# Patient Record
Sex: Female | Born: 1960 | Race: White | Hispanic: No | Marital: Married | Smoking: Never smoker
Health system: Southern US, Community
[De-identification: ages and names within clinical notes are randomized; demographics above are authoritative.]

## PROBLEM LIST (undated history)

## (undated) DIAGNOSIS — Z973 Presence of spectacles and contact lenses: Secondary | ICD-10-CM

## (undated) DIAGNOSIS — M1990 Unspecified osteoarthritis, unspecified site: Secondary | ICD-10-CM

## (undated) DIAGNOSIS — F32A Depression, unspecified: Secondary | ICD-10-CM

## (undated) DIAGNOSIS — R112 Nausea with vomiting, unspecified: Secondary | ICD-10-CM

## (undated) DIAGNOSIS — K219 Gastro-esophageal reflux disease without esophagitis: Secondary | ICD-10-CM

## (undated) DIAGNOSIS — E039 Hypothyroidism, unspecified: Secondary | ICD-10-CM

## (undated) DIAGNOSIS — I10 Essential (primary) hypertension: Secondary | ICD-10-CM

## (undated) DIAGNOSIS — R42 Dizziness and giddiness: Secondary | ICD-10-CM

## (undated) DIAGNOSIS — F419 Anxiety disorder, unspecified: Secondary | ICD-10-CM

## (undated) DIAGNOSIS — N189 Chronic kidney disease, unspecified: Secondary | ICD-10-CM

## (undated) DIAGNOSIS — E669 Obesity, unspecified: Secondary | ICD-10-CM

## (undated) DIAGNOSIS — G43909 Migraine, unspecified, not intractable, without status migrainosus: Secondary | ICD-10-CM

## (undated) DIAGNOSIS — D869 Sarcoidosis, unspecified: Secondary | ICD-10-CM

## (undated) DIAGNOSIS — T7840XA Allergy, unspecified, initial encounter: Secondary | ICD-10-CM

## (undated) DIAGNOSIS — K760 Fatty (change of) liver, not elsewhere classified: Secondary | ICD-10-CM

## (undated) DIAGNOSIS — R011 Cardiac murmur, unspecified: Secondary | ICD-10-CM

## (undated) DIAGNOSIS — N183 Chronic kidney disease, stage 3 unspecified: Secondary | ICD-10-CM

## (undated) DIAGNOSIS — Z9889 Other specified postprocedural states: Secondary | ICD-10-CM

## (undated) HISTORY — PX: LAPAROSCOPIC GASTRIC BANDING: SHX1100

## (undated) HISTORY — PX: LUNG BIOPSY: SHX232

## (undated) HISTORY — PX: TUBAL LIGATION: SHX77

## (undated) HISTORY — PX: COLONOSCOPY: SHX174

## (undated) HISTORY — PX: ABDOMINAL HYSTERECTOMY: SHX81

## (undated) HISTORY — PX: LAPAROSCOPIC OOPHERECTOMY: SHX6507

## (undated) HISTORY — PX: ABDOMINOPLASTY: SUR9

## (undated) HISTORY — PX: ESOPHAGOGASTRODUODENOSCOPY: SHX1529

---

## 1988-11-21 DIAGNOSIS — I639 Cerebral infarction, unspecified: Secondary | ICD-10-CM

## 1988-11-21 HISTORY — DX: Cerebral infarction, unspecified: I63.9

## 1999-02-19 ENCOUNTER — Other Ambulatory Visit: Admission: RE | Admit: 1999-02-19 | Discharge: 1999-02-19 | Payer: Self-pay | Admitting: Obstetrics and Gynecology

## 1999-03-18 ENCOUNTER — Ambulatory Visit (HOSPITAL_COMMUNITY): Admission: RE | Admit: 1999-03-18 | Discharge: 1999-03-18 | Payer: Self-pay | Admitting: Endocrinology

## 1999-03-19 ENCOUNTER — Encounter: Payer: Self-pay | Admitting: Endocrinology

## 1999-04-05 ENCOUNTER — Encounter: Payer: Self-pay | Admitting: Endocrinology

## 1999-04-05 ENCOUNTER — Ambulatory Visit (HOSPITAL_COMMUNITY): Admission: RE | Admit: 1999-04-05 | Discharge: 1999-04-05 | Payer: Self-pay | Admitting: Endocrinology

## 2000-02-23 ENCOUNTER — Other Ambulatory Visit: Admission: RE | Admit: 2000-02-23 | Discharge: 2000-02-23 | Payer: Self-pay | Admitting: Emergency Medicine

## 2005-10-20 ENCOUNTER — Ambulatory Visit: Payer: Self-pay | Admitting: Unknown Physician Specialty

## 2006-12-12 ENCOUNTER — Emergency Department: Payer: Self-pay | Admitting: Emergency Medicine

## 2007-11-02 ENCOUNTER — Encounter: Admission: RE | Admit: 2007-11-02 | Discharge: 2007-11-02 | Payer: Self-pay | Admitting: Neurosurgery

## 2009-05-21 ENCOUNTER — Ambulatory Visit: Payer: Self-pay | Admitting: Internal Medicine

## 2009-06-09 ENCOUNTER — Ambulatory Visit: Payer: Self-pay | Admitting: Family

## 2009-06-12 ENCOUNTER — Ambulatory Visit: Payer: Self-pay | Admitting: Internal Medicine

## 2009-06-17 ENCOUNTER — Ambulatory Visit: Payer: Self-pay | Admitting: Internal Medicine

## 2009-06-21 ENCOUNTER — Ambulatory Visit: Payer: Self-pay | Admitting: Internal Medicine

## 2009-07-21 ENCOUNTER — Ambulatory Visit: Payer: Self-pay | Admitting: General Surgery

## 2009-07-22 ENCOUNTER — Ambulatory Visit: Payer: Self-pay | Admitting: General Surgery

## 2011-05-06 ENCOUNTER — Ambulatory Visit: Payer: Self-pay | Admitting: Internal Medicine

## 2011-05-23 ENCOUNTER — Ambulatory Visit: Payer: Self-pay | Admitting: Internal Medicine

## 2012-11-06 ENCOUNTER — Ambulatory Visit: Payer: Self-pay | Admitting: Gastroenterology

## 2013-01-18 ENCOUNTER — Ambulatory Visit: Payer: Self-pay | Admitting: Nurse Practitioner

## 2013-08-26 ENCOUNTER — Ambulatory Visit: Payer: Self-pay | Admitting: Orthopedic Surgery

## 2014-11-05 ENCOUNTER — Emergency Department: Payer: Self-pay | Admitting: Student

## 2014-11-21 HISTORY — PX: REDUCTION MAMMAPLASTY: SUR839

## 2015-03-17 DIAGNOSIS — Z9884 Bariatric surgery status: Secondary | ICD-10-CM | POA: Insufficient documentation

## 2015-06-11 ENCOUNTER — Other Ambulatory Visit: Payer: Self-pay

## 2015-06-22 ENCOUNTER — Ambulatory Visit: Admission: RE | Admit: 2015-06-22 | Payer: Self-pay | Source: Ambulatory Visit | Admitting: Obstetrics and Gynecology

## 2015-06-22 ENCOUNTER — Encounter: Admission: RE | Payer: Self-pay | Source: Ambulatory Visit

## 2015-06-22 SURGERY — ANTERIOR (CYSTOCELE) AND POSTERIOR REPAIR (RECTOCELE)
Anesthesia: Choice

## 2015-11-09 ENCOUNTER — Other Ambulatory Visit: Payer: Self-pay | Admitting: Nurse Practitioner

## 2016-11-09 ENCOUNTER — Other Ambulatory Visit: Payer: Self-pay | Admitting: Nurse Practitioner

## 2016-12-27 ENCOUNTER — Other Ambulatory Visit: Payer: Self-pay | Admitting: Orthopedic Surgery

## 2016-12-27 DIAGNOSIS — M5412 Radiculopathy, cervical region: Secondary | ICD-10-CM

## 2016-12-27 DIAGNOSIS — M5030 Other cervical disc degeneration, unspecified cervical region: Secondary | ICD-10-CM

## 2017-01-05 ENCOUNTER — Ambulatory Visit
Admission: RE | Admit: 2017-01-05 | Discharge: 2017-01-05 | Disposition: A | Discharge status: Home / Self Care | Payer: BC Managed Care – PPO | Source: Ambulatory Visit | Attending: Orthopedic Surgery | Admitting: Orthopedic Surgery

## 2017-01-05 ENCOUNTER — Encounter: Payer: Self-pay | Admitting: Radiology

## 2017-01-05 DIAGNOSIS — M5030 Other cervical disc degeneration, unspecified cervical region: Secondary | ICD-10-CM | POA: Insufficient documentation

## 2017-01-05 DIAGNOSIS — M5412 Radiculopathy, cervical region: Secondary | ICD-10-CM | POA: Diagnosis not present

## 2018-11-27 DIAGNOSIS — Z862 Personal history of diseases of the blood and blood-forming organs and certain disorders involving the immune mechanism: Secondary | ICD-10-CM | POA: Insufficient documentation

## 2018-11-27 DIAGNOSIS — Z8673 Personal history of transient ischemic attack (TIA), and cerebral infarction without residual deficits: Secondary | ICD-10-CM | POA: Insufficient documentation

## 2018-11-27 DIAGNOSIS — I6930 Unspecified sequelae of cerebral infarction: Secondary | ICD-10-CM | POA: Insufficient documentation

## 2018-11-27 DIAGNOSIS — F419 Anxiety disorder, unspecified: Secondary | ICD-10-CM | POA: Insufficient documentation

## 2018-11-27 DIAGNOSIS — F32A Depression, unspecified: Secondary | ICD-10-CM | POA: Insufficient documentation

## 2019-04-02 DIAGNOSIS — Z83719 Family history of colon polyps, unspecified: Secondary | ICD-10-CM | POA: Insufficient documentation

## 2019-04-18 ENCOUNTER — Other Ambulatory Visit: Payer: Self-pay | Admitting: Family Medicine

## 2019-04-18 DIAGNOSIS — Z1231 Encounter for screening mammogram for malignant neoplasm of breast: Secondary | ICD-10-CM

## 2019-05-30 ENCOUNTER — Other Ambulatory Visit: Payer: Self-pay

## 2019-05-30 ENCOUNTER — Ambulatory Visit
Admission: RE | Admit: 2019-05-30 | Discharge: 2019-05-30 | Disposition: A | Discharge status: Home / Self Care | Payer: BC Managed Care – PPO | Source: Ambulatory Visit | Attending: Family Medicine | Admitting: Family Medicine

## 2019-05-30 DIAGNOSIS — Z1231 Encounter for screening mammogram for malignant neoplasm of breast: Secondary | ICD-10-CM | POA: Diagnosis present

## 2020-03-21 ENCOUNTER — Telehealth: Payer: BC Managed Care – PPO | Admitting: Physician Assistant

## 2020-03-21 DIAGNOSIS — J019 Acute sinusitis, unspecified: Secondary | ICD-10-CM

## 2020-03-21 MED ORDER — FLUTICASONE PROPIONATE 50 MCG/ACT NA SUSP
2.0000 | Freq: Every day | NASAL | 6 refills | Status: DC
Start: 2020-03-21 — End: 2024-01-16

## 2020-03-21 NOTE — Progress Notes (Signed)

## 2020-04-17 DIAGNOSIS — I351 Nonrheumatic aortic (valve) insufficiency: Secondary | ICD-10-CM | POA: Insufficient documentation

## 2020-05-13 ENCOUNTER — Other Ambulatory Visit: Payer: Self-pay | Admitting: Family Medicine

## 2020-05-13 DIAGNOSIS — Z1231 Encounter for screening mammogram for malignant neoplasm of breast: Secondary | ICD-10-CM

## 2020-06-01 ENCOUNTER — Ambulatory Visit
Admission: RE | Admit: 2020-06-01 | Discharge: 2020-06-01 | Disposition: A | Discharge status: Home / Self Care | Payer: BC Managed Care – PPO | Source: Ambulatory Visit | Attending: Family Medicine | Admitting: Family Medicine

## 2020-06-01 DIAGNOSIS — Z1231 Encounter for screening mammogram for malignant neoplasm of breast: Secondary | ICD-10-CM | POA: Insufficient documentation

## 2020-08-03 HISTORY — PX: OTHER SURGICAL HISTORY: SHX169

## 2020-08-10 ENCOUNTER — Telehealth: Payer: Self-pay | Admitting: Unknown Physician Specialty

## 2020-08-10 ENCOUNTER — Other Ambulatory Visit: Payer: Self-pay | Admitting: Unknown Physician Specialty

## 2020-08-10 DIAGNOSIS — U071 COVID-19: Secondary | ICD-10-CM

## 2020-08-10 HISTORY — DX: COVID-19: U07.1

## 2020-08-10 NOTE — Telephone Encounter (Signed)
I connected by phone with Kara Kirk on 08/10/2020 at 7:55 PM to discuss the potential use of a new treatment for mild to moderate COVID-19 viral infection in non-hospitalized patients.  This patient is a 59 y.o. female that meets the FDA criteria for Emergency Use Authorization of COVID monoclonal antibody casirivimab/imdevimab.  Has a (+) direct SARS-CoV-2 viral test result  Has mild or moderate COVID-19   Is NOT hospitalized due to COVID-19  Is within 10 days of symptom onset  Has at least one of the high risk factor(s) for progression to severe COVID-19 and/or hospitalization as defined in EUA.  Specific high risk criteria : BMI > 25   I have spoken and communicated the following to the patient or parent/caregiver regarding COVID monoclonal antibody treatment:  1. FDA has authorized the emergency use for the treatment of mild to moderate COVID-19 in adults and pediatric patients with positive results of direct SARS-CoV-2 viral testing who are 72 years of age and older weighing at least 40 kg, and who are at high risk for progressing to severe COVID-19 and/or hospitalization.  2. The significant known and potential risks and benefits of COVID monoclonal antibody, and the extent to which such potential risks and benefits are unknown.  3. Information on available alternative treatments and the risks and benefits of those alternatives, including clinical trials.  4. Patients treated with COVID monoclonal antibody should continue to self-isolate and use infection control measures (e.g., wear mask, isolate, social distance, avoid sharing personal items, clean and disinfect "high touch" surfaces, and frequent handwashing) according to CDC guidelines.   5. The patient or parent/caregiver has the option to accept or refuse COVID monoclonal antibody treatment.  After reviewing this information with the patient, The patient agreed to proceed with receiving casirivimab\imdevimab infusion  and will be provided a copy of the Fact sheet prior to receiving the infusion. Kara Kirk 08/10/2020 7:55 PM Sx onset 9/17

## 2020-08-11 ENCOUNTER — Other Ambulatory Visit (HOSPITAL_COMMUNITY): Payer: Self-pay

## 2020-08-11 ENCOUNTER — Ambulatory Visit (HOSPITAL_COMMUNITY)
Admission: RE | Admit: 2020-08-11 | Discharge: 2020-08-11 | Disposition: A | Discharge status: Home / Self Care | Payer: BC Managed Care – PPO | Source: Ambulatory Visit | Attending: Pulmonary Disease | Admitting: Pulmonary Disease

## 2020-08-11 DIAGNOSIS — U071 COVID-19: Secondary | ICD-10-CM | POA: Diagnosis not present

## 2020-08-11 MED ORDER — DIPHENHYDRAMINE HCL 50 MG/ML IJ SOLN: 50.0000 mg | Freq: Once | INTRAMUSCULAR | Status: DC | PRN

## 2020-08-11 MED ORDER — SODIUM CHLORIDE 0.9 % IV SOLN
1200.0000 mg | Freq: Once | INTRAVENOUS | Status: AC
  Administered 2020-08-11: 1200 mg via INTRAVENOUS

## 2020-08-11 MED ORDER — METHYLPREDNISOLONE SODIUM SUCC 125 MG IJ SOLR: 125.0000 mg | Freq: Once | INTRAMUSCULAR | Status: DC | PRN

## 2020-08-11 MED ORDER — FAMOTIDINE IN NACL 20-0.9 MG/50ML-% IV SOLN: 20.0000 mg | Freq: Once | INTRAVENOUS | Status: DC | PRN

## 2020-08-11 MED ORDER — SODIUM CHLORIDE 0.9 % IV SOLN: INTRAVENOUS | Status: DC | PRN

## 2020-08-11 MED ORDER — ALBUTEROL SULFATE HFA 108 (90 BASE) MCG/ACT IN AERS: 2.0000 | INHALATION_SPRAY | Freq: Once | RESPIRATORY_TRACT | Status: DC | PRN

## 2020-08-11 MED ORDER — EPINEPHRINE 0.3 MG/0.3ML IJ SOAJ: 0.3000 mg | Freq: Once | INTRAMUSCULAR | Status: DC | PRN

## 2020-08-11 NOTE — Discharge Instructions (Signed)

## 2020-08-11 NOTE — Progress Notes (Signed)
°  Diagnosis: COVID-19  Physician: Dr. Joya Gaskins  Procedure: [facility] Med: casirivimab\imdevimab infusion - Provided patient with casirivimab\imdevimab fact sheet for patients, parents and caregivers prior to infusion.  Complications: No immediate complications noted.  Discharge: Discharged home   Otho Ket 08/11/2020

## 2020-10-08 ENCOUNTER — Encounter: Payer: Self-pay | Admitting: Otolaryngology

## 2020-10-08 ENCOUNTER — Encounter: Payer: Self-pay | Admitting: Anesthesiology

## 2020-10-08 ENCOUNTER — Other Ambulatory Visit: Payer: Self-pay

## 2020-10-12 ENCOUNTER — Other Ambulatory Visit: Payer: BC Managed Care – PPO

## 2020-10-14 ENCOUNTER — Ambulatory Visit: Admission: RE | Admit: 2020-10-14 | Payer: BC Managed Care – PPO | Source: Home / Self Care | Admitting: Otolaryngology

## 2020-10-14 HISTORY — DX: Essential (primary) hypertension: I10

## 2020-10-14 HISTORY — DX: Dizziness and giddiness: R42

## 2020-10-14 HISTORY — DX: Presence of spectacles and contact lenses: Z97.3

## 2020-10-14 HISTORY — DX: Migraine, unspecified, not intractable, without status migrainosus: G43.909

## 2020-10-14 HISTORY — DX: Cardiac murmur, unspecified: R01.1

## 2020-10-14 SURGERY — SINUS SURGERY, WITH IMAGING GUIDANCE
Anesthesia: General | Laterality: Right

## 2021-05-19 ENCOUNTER — Other Ambulatory Visit: Payer: Self-pay | Admitting: Family Medicine

## 2021-05-19 DIAGNOSIS — Z1231 Encounter for screening mammogram for malignant neoplasm of breast: Secondary | ICD-10-CM

## 2021-06-03 ENCOUNTER — Inpatient Hospital Stay: Admission: RE | Admit: 2021-06-03 | Payer: BC Managed Care – PPO | Source: Ambulatory Visit

## 2021-06-10 ENCOUNTER — Ambulatory Visit
Admission: RE | Admit: 2021-06-10 | Discharge: 2021-06-10 | Disposition: A | Discharge status: Home / Self Care | Payer: BC Managed Care – PPO | Source: Ambulatory Visit | Attending: Family Medicine | Admitting: Family Medicine

## 2021-06-10 ENCOUNTER — Other Ambulatory Visit: Payer: Self-pay

## 2021-06-10 DIAGNOSIS — Z1231 Encounter for screening mammogram for malignant neoplasm of breast: Secondary | ICD-10-CM | POA: Insufficient documentation

## 2021-06-30 DIAGNOSIS — Z9884 Bariatric surgery status: Secondary | ICD-10-CM | POA: Insufficient documentation

## 2021-09-16 ENCOUNTER — Encounter: Payer: Self-pay | Admitting: *Deleted

## 2021-09-17 ENCOUNTER — Ambulatory Visit: Payer: BC Managed Care – PPO | Admitting: Certified Registered Nurse Anesthetist

## 2021-09-17 ENCOUNTER — Ambulatory Visit
Admission: RE | Admit: 2021-09-17 | Discharge: 2021-09-17 | Disposition: A | Discharge status: Home / Self Care | Payer: BC Managed Care – PPO | Attending: Gastroenterology | Admitting: Gastroenterology

## 2021-09-17 ENCOUNTER — Encounter
Admission: RE | Disposition: A | Discharge status: Home / Self Care | Payer: Self-pay | Source: Home / Self Care | Attending: Gastroenterology

## 2021-09-17 DIAGNOSIS — Z79890 Hormone replacement therapy (postmenopausal): Secondary | ICD-10-CM | POA: Diagnosis not present

## 2021-09-17 DIAGNOSIS — E039 Hypothyroidism, unspecified: Secondary | ICD-10-CM | POA: Diagnosis not present

## 2021-09-17 DIAGNOSIS — Z79899 Other long term (current) drug therapy: Secondary | ICD-10-CM | POA: Insufficient documentation

## 2021-09-17 DIAGNOSIS — Z90710 Acquired absence of both cervix and uterus: Secondary | ICD-10-CM | POA: Diagnosis not present

## 2021-09-17 DIAGNOSIS — Z880 Allergy status to penicillin: Secondary | ICD-10-CM | POA: Diagnosis not present

## 2021-09-17 DIAGNOSIS — Z8616 Personal history of COVID-19: Secondary | ICD-10-CM | POA: Insufficient documentation

## 2021-09-17 DIAGNOSIS — Z1211 Encounter for screening for malignant neoplasm of colon: Secondary | ICD-10-CM | POA: Diagnosis present

## 2021-09-17 DIAGNOSIS — Z9884 Bariatric surgery status: Secondary | ICD-10-CM | POA: Diagnosis not present

## 2021-09-17 DIAGNOSIS — Z8371 Family history of colonic polyps: Secondary | ICD-10-CM | POA: Insufficient documentation

## 2021-09-17 DIAGNOSIS — D120 Benign neoplasm of cecum: Secondary | ICD-10-CM | POA: Insufficient documentation

## 2021-09-17 DIAGNOSIS — K640 First degree hemorrhoids: Secondary | ICD-10-CM | POA: Insufficient documentation

## 2021-09-17 HISTORY — DX: Depression, unspecified: F32.A

## 2021-09-17 HISTORY — DX: Hypothyroidism, unspecified: E03.9

## 2021-09-17 HISTORY — DX: Anxiety disorder, unspecified: F41.9

## 2021-09-17 HISTORY — DX: Other specified postprocedural states: R11.2

## 2021-09-17 HISTORY — PX: COLONOSCOPY WITH PROPOFOL: SHX5780

## 2021-09-17 HISTORY — DX: Sarcoidosis, unspecified: D86.9

## 2021-09-17 HISTORY — DX: Obesity, unspecified: E66.9

## 2021-09-17 HISTORY — DX: Other specified postprocedural states: Z98.890

## 2021-09-17 HISTORY — DX: Chronic kidney disease, unspecified: N18.9

## 2021-09-17 HISTORY — DX: Gastro-esophageal reflux disease without esophagitis: K21.9

## 2021-09-17 HISTORY — DX: Unspecified osteoarthritis, unspecified site: M19.90

## 2021-09-17 HISTORY — DX: Allergy, unspecified, initial encounter: T78.40XA

## 2021-09-17 HISTORY — DX: Chronic kidney disease, stage 3 unspecified: N18.30

## 2021-09-17 HISTORY — DX: Fatty (change of) liver, not elsewhere classified: K76.0

## 2021-09-17 SURGERY — COLONOSCOPY WITH PROPOFOL
Anesthesia: General

## 2021-09-17 MED ORDER — PROPOFOL 10 MG/ML IV BOLUS
INTRAVENOUS | Status: DC | PRN
  Administered 2021-09-17 (×5): 30 mg via INTRAVENOUS

## 2021-09-17 MED ORDER — SODIUM CHLORIDE 0.9 % IV SOLN
INTRAVENOUS | Status: DC
  Administered 2021-09-17: 20 mL/h via INTRAVENOUS

## 2021-09-17 MED ORDER — LIDOCAINE HCL (CARDIAC) PF 100 MG/5ML IV SOSY
PREFILLED_SYRINGE | INTRAVENOUS | Status: DC | PRN
  Administered 2021-09-17: 25 mg via INTRAVENOUS

## 2021-09-17 MED ORDER — PROPOFOL 500 MG/50ML IV EMUL
INTRAVENOUS | Status: DC | PRN
  Administered 2021-09-17: 100 ug/kg/min via INTRAVENOUS

## 2021-09-17 MED ORDER — PROPOFOL 500 MG/50ML IV EMUL
INTRAVENOUS | Status: AC
  Filled 2021-09-17: qty 50

## 2021-09-17 NOTE — Anesthesia Preprocedure Evaluation (Signed)
Anesthesia Evaluation  Patient identified by MRN, date of birth, ID band Patient awake    Reviewed: Allergy & Precautions, NPO status , Patient's Chart, lab work & pertinent test results  History of Anesthesia Complications Negative for: history of anesthetic complications  Airway Mallampati: I   Neck ROM: Full    Dental no notable dental hx.    Pulmonary  Sarcoidosis    Pulmonary exam normal breath sounds clear to auscultation       Cardiovascular hypertension, Normal cardiovascular exam Rhythm:Regular Rate:Normal     Neuro/Psych PSYCHIATRIC DISORDERS Anxiety Depression Vertigo  CVA (1990, residual left-sided numbness)    GI/Hepatic NAFLD S/p lap band   Endo/Other  negative endocrine ROS  Renal/GU Renal disease (stage III CKD)     Musculoskeletal   Abdominal   Peds  Hematology negative hematology ROS (+)   Anesthesia Other Findings   Reproductive/Obstetrics                             Anesthesia Physical Anesthesia Plan  ASA: 2  Anesthesia Plan: General   Post-op Pain Management:    Induction: Intravenous  PONV Risk Score and Plan: 3 and Propofol infusion, TIVA and Treatment may vary due to age or medical condition  Airway Management Planned: Natural Airway  Additional Equipment:   Intra-op Plan:   Post-operative Plan:   Informed Consent: I have reviewed the patients History and Physical, chart, labs and discussed the procedure including the risks, benefits and alternatives for the proposed anesthesia with the patient or authorized representative who has indicated his/her understanding and acceptance.       Plan Discussed with: CRNA  Anesthesia Plan Comments:         Anesthesia Quick Evaluation

## 2021-09-17 NOTE — Interval H&P Note (Signed)
History and Physical Interval Note:  09/17/2021 9:47 AM  Kara Kirk  has presented today for surgery, with the diagnosis of Family History Colon Polyps.  The various methods of treatment have been discussed with the patient and family. After consideration of risks, benefits and other options for treatment, the patient has consented to  Procedure(s): COLONOSCOPY WITH PROPOFOL (N/A) as a surgical intervention.  The patient's history has been reviewed, patient examined, no change in status, stable for surgery.  I have reviewed the patient's chart and labs.  Questions were answered to the patient's satisfaction.     Lesly Rubenstein  Ok to proceed with colonoscopy

## 2021-09-17 NOTE — Anesthesia Postprocedure Evaluation (Signed)
Anesthesia Post Note  Patient: Kara Kirk  Procedure(s) Performed: COLONOSCOPY WITH PROPOFOL  Patient location during evaluation: PACU Anesthesia Type: General Level of consciousness: awake and alert, oriented and patient cooperative Pain management: pain level controlled Vital Signs Assessment: post-procedure vital signs reviewed and stable Respiratory status: spontaneous breathing, nonlabored ventilation and respiratory function stable Cardiovascular status: blood pressure returned to baseline and stable Postop Assessment: adequate PO intake Anesthetic complications: no   No notable events documented.   Last Vitals:  Vitals:   09/17/21 1050 09/17/21 1100  BP: 132/63   Pulse: 68 71  Resp: 10 12  Temp:    SpO2: 99% 98%    Last Pain:  Vitals:   09/17/21 0916  TempSrc: Temporal  PainSc: 0-No pain                 Darrin Nipper

## 2021-09-17 NOTE — Transfer of Care (Signed)
Immediate Anesthesia Transfer of Care Note  Patient: Kara Kirk  Procedure(s) Performed: COLONOSCOPY WITH PROPOFOL  Patient Location: PACU  Anesthesia Type:General  Level of Consciousness: sedated  Airway & Oxygen Therapy: Patient Spontanous Breathing  Post-op Assessment: Report given to RN  Post vital signs: Reviewed and stable  Last Vitals:  Vitals Value Taken Time  BP 109/54 09/17/21 1022  Temp    Pulse 79 09/17/21 1022  Resp 16 09/17/21 1022  SpO2 98 % 09/17/21 1022    Last Pain:  Vitals:   09/17/21 0916  TempSrc: Temporal  PainSc: 0-No pain         Complications: No notable events documented.

## 2021-09-17 NOTE — H&P (Signed)
  Outpatient short stay form Pre-procedure 09/17/2021  Lesly Rubenstein, MD  Primary Physician: Dion Body, MD  Reason for visit:  Screening/Family history of polyps  History of present illness:   60 y/o lady with history of gastric bypass and hypothyroidism here for screening colonoscopy due to family history of colon polyps in Mother and Father. History of hysterectomy. No blood thinners. No new symptoms.    Current Facility-Administered Medications:    0.9 %  sodium chloride infusion, , Intravenous, Continuous, Shawanda Sievert, Hilton Cork, MD, Last Rate: 20 mL/hr at 09/17/21 0925, 20 mL/hr at 09/17/21 0925  Medications Prior to Admission  Medication Sig Dispense Refill Last Dose   azelastine (ASTELIN) 0.1 % nasal spray Place into both nostrils 2 (two) times daily. Use in each nostril as directed   09/16/2021   buPROPion (WELLBUTRIN XL) 300 MG 24 hr tablet Take 300 mg by mouth daily.   09/16/2021   calcium gluconate 500 MG tablet Take 1 tablet by mouth daily.   09/16/2021   fluticasone (FLONASE) 50 MCG/ACT nasal spray Place 2 sprays into both nostrils daily. 16 g 6 09/16/2021   levothyroxine (SYNTHROID) 150 MCG tablet Take 150 mcg by mouth daily before breakfast.   09/17/2021 at 0530   losartan (COZAAR) 25 MG tablet Take 25 mg by mouth daily.   09/16/2021   Multiple Vitamin (MULTIVITAMIN) tablet Take 1 tablet by mouth daily.   09/16/2021   pantoprazole (PROTONIX) 40 MG tablet Take 40 mg by mouth daily.   09/16/2021   pravastatin (PRAVACHOL) 20 MG tablet Take 20 mg by mouth daily.   09/16/2021   thyroid (ARMOUR) 90 MG tablet Take 90 mg by mouth daily.   09/16/2021   vitamin B-12 (CYANOCOBALAMIN) 1000 MCG tablet Take 1,000 mcg by mouth daily.   09/16/2021     Allergies  Allergen Reactions   Penicillins Shortness Of Breath     Past Medical History:  Diagnosis Date   Allergy    Anxiety    Chronic kidney disease    COVID-19 08/10/2020   Loss of taste and smell.  Fatigue.   All resolved by 4 weeks.   CRI (chronic renal insufficiency), stage 3 (moderate) (HCC)    Depression    DJD (degenerative joint disease)    Fatty liver disease, nonalcoholic    GERD (gastroesophageal reflux disease)    Heart murmur    Hypertension    Hypothyroidism    Migraine headache    1-2/month   Obesity    s/p lap band   PONV (postoperative nausea and vomiting)    Sarcoidosis    Stroke ([place]) 1990   Jan/Feb  6 "Mini-strokes", then 1 "larger" stroke that resulted in some left sided numbness   Vertigo    2-3 episodes/year   Wears contact lenses     Review of systems:  Otherwise negative.    Physical Exam  Gen: Alert, oriented. Appears stated age.  HEENT: PERRLA. Lungs: No respiratory distress CV: RRR Abd: soft, benign, no masses Ext: No edema    Planned procedures: Proceed with colonoscopy. The patient understands the nature of the planned procedure, indications, risks, alternatives and potential complications including but not limited to bleeding, infection, perforation, damage to internal organs and possible oversedation/side effects from anesthesia. The patient agrees and gives consent to proceed.  Please refer to procedure notes for findings, recommendations and patient disposition/instructions.     Lesly Rubenstein, MD [facility]

## 2021-09-17 NOTE — Op Note (Signed)
[facility] Patient Name: Kara Kirk Procedure Date: 09/17/2021 9:47 AM MRN: [id number] Account #: [id number] Date of Birth: 1961-06-21 Admit Type: Outpatient Age: 60 Room: [facility] ENDO ROOM 3 Gender: Female Note Status: Finalized Instrument Name: Jasper Riling [id number] Procedure:             Colonoscopy Indications:           Colon cancer screening in patient at increased risk:                         Family history of colon polyps in multiple 1st-degree                         relatives Providers:             Andrey Farmer MD, MD Referring MD:          Dion Body (Referring MD) Medicines:             Monitored Anesthesia Care Complications:         No immediate complications. Estimated blood loss:                         Minimal. Procedure:             Pre-Anesthesia Assessment:                        - Prior to the procedure, a History and Physical was                         performed, and patient medications and allergies were                         reviewed. The patient is competent. The risks and                         benefits of the procedure and the sedation options and                         risks were discussed with the patient. All questions                         were answered and informed consent was obtained.                         Patient identification and proposed procedure were                         verified by the physician, the nurse, the anesthetist                         and the technician in the endoscopy suite. Mental                         Status Examination: alert and oriented. Airway                         Examination: normal oropharyngeal airway and neck  mobility. Respiratory Examination: clear to                         auscultation. CV Examination: normal. Prophylactic                         Antibiotics: The patient does not require prophylactic                          antibiotics. Prior Anticoagulants: The patient has                         taken no previous anticoagulant or antiplatelet                         agents. ASA Grade Assessment: II - A patient with mild                         systemic disease. After reviewing the risks and                         benefits, the patient was deemed in satisfactory                         condition to undergo the procedure. The anesthesia                         plan was to use monitored anesthesia care (MAC).                         Immediately prior to administration of medications,                         the patient was re-assessed for adequacy to receive                         sedatives. The heart rate, respiratory rate, oxygen                         saturations, blood pressure, adequacy of pulmonary                         ventilation, and response to care were monitored                         throughout the procedure. The physical status of the                         patient was re-assessed after the procedure.                        After obtaining informed consent, the colonoscope was                         passed under direct vision. Throughout the procedure,                         the patient's blood pressure, pulse, and oxygen  saturations were monitored continuously. The                         Colonoscope was introduced through the anus and                         advanced to the the cecum, identified by appendiceal                         orifice and ileocecal valve. The colonoscopy was                         somewhat difficult due to significant looping.                         Successful completion of the procedure was aided by                         applying abdominal pressure. The patient tolerated the                         procedure well. The quality of the bowel preparation                         was good. Findings:      The perianal and digital rectal  examinations were normal.      A 2 mm polyp was found in the cecum. The polyp was sessile. The polyp       was removed with a cold snare. Resection and retrieval were complete.       Estimated blood loss was minimal.      Internal hemorrhoids were found during retroflexion. The hemorrhoids       were Grade I (internal hemorrhoids that do not prolapse). Estimated       blood loss was minimal.      The exam was otherwise without abnormality on direct and retroflexion       views. Impression:            - One 2 mm polyp in the cecum, removed with a cold                         snare. Resected and retrieved.                        - Internal hemorrhoids.                        - The examination was otherwise normal on direct and                         retroflexion views. Recommendation:        - Discharge patient to home.                        - Resume previous diet.                        - Continue present medications.                        -  Await pathology results.                        - Repeat colonoscopy for surveillance based on                         pathology results.                        - Return to referring physician as previously                         scheduled. Procedure Code(s):     --- Professional ---                        [phone], Colonoscopy, flexible; with removal of                         tumor(s), polyp(s), or other lesion(s) by snare                         technique Diagnosis Code(s):     --- Professional ---                        Z83.71, Family history of colonic polyps                        K63.5, Polyp of colon                        K64.0, First degree hemorrhoids CPT copyright 2019 [employer]. All rights reserved. The codes documented in this report are preliminary and upon coder review may  be revised to meet current compliance requirements. Andrey Farmer MD, MD 09/17/2021 10:26:02 AM Number of Addenda: 0 Note Initiated  On: 09/17/2021 9:47 AM Scope Withdrawal Time: 0 hours 9 minutes 9 seconds  Total Procedure Duration: 0 hours 17 minutes 24 seconds  Estimated Blood Loss:  Estimated blood loss was minimal.      [facility]

## 2021-09-20 ENCOUNTER — Encounter: Payer: Self-pay | Admitting: Gastroenterology

## 2021-09-20 LAB — SURGICAL PATHOLOGY

## 2021-10-26 ENCOUNTER — Encounter: Payer: Self-pay | Admitting: Otolaryngology

## 2021-11-01 DIAGNOSIS — Z8669 Personal history of other diseases of the nervous system and sense organs: Secondary | ICD-10-CM | POA: Insufficient documentation

## 2021-11-02 NOTE — Discharge Instructions (Signed)
[facility] ENDOSCOPIC SINUS SURGERY [facility]  What is Functional Endoscopic Sinus Surgery?  The Surgery involves making the natural openings of the sinuses larger by removing the bony partitions that separate the sinuses from the nasal cavity.  The natural sinus lining is preserved as much as possible to allow the sinuses to resume normal function after the surgery.  In some patients nasal polyps (excessively swollen lining of the sinuses) may be removed to relieve obstruction of the sinus openings.  The surgery is performed through the nose using lighted scopes, which eliminates the need for incisions on the face.  A septoplasty is a different procedure which is sometimes performed with sinus surgery.  It involves straightening the boy partition that separates the two sides of your nose.  A crooked or deviated septum may need repair if is obstructing the sinuses or nasal airflow.  Turbinate reduction is also often performed during sinus surgery.  The turbinates are bony proturberances from the side walls of the nose which swell and can obstruct the nose in patients with sinus and allergy problems.  Their size can be surgically reduced to help relieve nasal obstruction.  What Can Sinus Surgery Do For Me?  Sinus surgery can reduce the frequency of sinus infections requiring antibiotic treatment.  This can provide improvement in nasal congestion, post-nasal drainage, facial pressure and nasal obstruction.  Surgery will NOT prevent you from ever having an infection again, so it usually only for patients who get infections 4 or more times yearly requiring antibiotics, or for infections that do not clear with antibiotics.  It will not cure nasal allergies, so patients with allergies may still require medication to treat their allergies after surgery. Surgery may improve headaches related to sinusitis, however, some people will continue to  require medication to control sinus headaches related to allergies.  Surgery will do nothing for other forms of headache (migraine, tension or cluster).  What Are the Risks of Endoscopic Sinus Surgery?  Current techniques allow surgery to be performed safely with little risk, however, there are rare complications that patients should be aware of.  Because the sinuses are located around the eyes, there is risk of eye injury, including blindness, though again, this would be quite rare. This is usually a result of bleeding behind the eye during surgery, which can effect vision, though there are treatments to protect the vision and prevent permanent injury. More serious complications would include bleeding inside the brain cavity or damage to the brain.This happens when the fluid around the brain leaks out into the sinus cavity.  Again, all of these complications are uncommon, and spinal fluid leaks can be safely managed surgically if they occur.  The most common complication of sinus surgery is bleeding from the nose, which may require packing or cauterization of the nose.  Patients with polyps may experience recurrence of the polyps that would require revision surgery.  Alterations of sense of smell or injury to the tear ducts are also rare complications.   What is the Surgery Like, and what is the Recovery?  The Surgery usually takes a couple of hours to perform, and is usually performed under a general anesthetic (completely asleep).  Patients are usually discharged home after a couple of hours.  Sometimes during surgery it is necessary to pack the nose to control bleeding, and the packing is left in place for 24 - 48 hours, and removed by your surgeon.  If   a septoplasty was performed during the procedure, there is often a splint placed which must be removed after 5-7 days.   Discomfort: Pain is usually mild to moderate, and can be controlled by prescription pain medication or acetaminophen (Tylenol).   Aspirin, Ibuprofen (Advil, Motrin), or Naprosyn (Aleve) should be avoided, as they can cause increased bleeding.  Most patients feel sinus pressure like they have a bad head cold for several days.  Sleeping with your head elevated can help reduce swelling and facial pressure, as can ice packs over the face.  A humidifier may be helpful to keep the mucous and blood from drying in the nose.   Diet: There are no specific diet restrictions, however, you should generally start with clear liquids and a light diet of bland foods because the anesthetic can cause some nausea.  Advance your diet depending on how your stomach feels.  Taking your pain medication with food will often help reduce stomach upset which pain medications can cause.  Nasal Saline Irrigation: It is important to remove blood clots and dried mucous from the nose as it is healing.  This is done by having you irrigate the nose at least 3 - 4 times daily with a salt water solution.  We recommend using NeilMed Sinus Rinse (available at the drug store).  Fill the squeeze bottle with the solution, bend over a sink, and insert the tip of the squeeze bottle into the nose  of an inch.  Point the tip of the squeeze bottle towards the inside corner of the eye on the same side your irrigating.  Squeeze the bottle and gently irrigate the nose.  If you bend forward as you do this, most of the fluid will flow back out of the nose, instead of down your throat.   The solution should be warm, near body temperature, when you irrigate.   Each time you irrigate, you should use a full squeeze bottle.   Note that if you are instructed to use Nasal Steroid Sprays at any time after your surgery, irrigate with saline BEFORE using the steroid spray, so you do not wash it all out of the nose. Another product, Nasal Saline Gel (such as AYR Nasal Saline Gel) can be applied in each nostril 3 - 4 times daily to moisture the nose and reduce scabbing or crusting.  Bleeding:   Bloody drainage from the nose can be expected for several days, and patients are instructed to irrigate their nose frequently with salt water to help remove mucous and blood clots.  The drainage may be dark red or brown, though some fresh blood may be seen intermittently, especially after irrigation.  Do not blow you nose, as bleeding may occur. If you must sneeze, keep your mouth open to allow air to escape through your mouth.  If heavy bleeding occurs: Irrigate the nose with saline to rinse out clots, then spray the nose 3 - 4 times with Afrin Nasal Decongestant Spray.  The spray will constrict the blood vessels to slow bleeding.  Pinch the lower half of your nose shut to apply pressure, and lay down with your head elevated.  Ice packs over the nose may help as well. If bleeding persists despite these measures, you should notify your doctor.  Do not use the Afrin routinely to control nasal congestion after surgery, as it can result in worsening congestion and may affect healing.     Activity: Return to work varies among patients. Most patients will be out   of work at least 5 - 7 days to recover.  Patient may return to work after they are off of narcotic pain medication, and feeling well enough to perform the functions of their job.  Patients must avoid heavy lifting (over 10 pounds) or strenuous physical for 2 weeks after surgery, so your employer may need to assign you to light duty, or keep you out of work longer if light duty is not possible.  NOTE: you should not drive, operate dangerous machinery, do any mentally demanding tasks or make any important legal or financial decisions while on narcotic pain medication and recovering from the general anesthetic.    Call Your Doctor Immediately if You Have Any of the Following: Bleeding that you cannot control with the above measures Loss of vision, double vision, bulging of the eye or black eyes. Fever over 101 degrees Neck stiffness with severe headache,  fever, nausea and change in mental state. You are always encouraged to call anytime with concerns, however, please call with requests for pain medication refills during office hours.  Office Endoscopy: During follow-up visits your doctor will remove any packing or splints that may have been placed and evaluate and clean your sinuses endoscopically.  Topical anesthetic will be used to make this as comfortable as possible, though you may want to take your pain medication prior to the visit.  How often this will need to be done varies from patient to patient.  After complete recovery from the surgery, you may need follow-up endoscopy from time to time, particularly if there is concern of recurrent infection or nasal polyps.  

## 2021-11-03 ENCOUNTER — Encounter: Payer: Self-pay | Admitting: Otolaryngology

## 2021-11-03 ENCOUNTER — Encounter
Admission: RE | Disposition: A | Discharge status: Home / Self Care | Payer: Self-pay | Source: Home / Self Care | Attending: Otolaryngology

## 2021-11-03 ENCOUNTER — Ambulatory Visit: Payer: BC Managed Care – PPO | Admitting: Anesthesiology

## 2021-11-03 ENCOUNTER — Other Ambulatory Visit: Payer: Self-pay

## 2021-11-03 ENCOUNTER — Ambulatory Visit
Admission: RE | Admit: 2021-11-03 | Discharge: 2021-11-03 | Disposition: A | Discharge status: Home / Self Care | Payer: BC Managed Care – PPO | Attending: Otolaryngology | Admitting: Otolaryngology

## 2021-11-03 DIAGNOSIS — I10 Essential (primary) hypertension: Secondary | ICD-10-CM | POA: Insufficient documentation

## 2021-11-03 DIAGNOSIS — J342 Deviated nasal septum: Secondary | ICD-10-CM | POA: Diagnosis present

## 2021-11-03 DIAGNOSIS — Z79899 Other long term (current) drug therapy: Secondary | ICD-10-CM | POA: Insufficient documentation

## 2021-11-03 DIAGNOSIS — K219 Gastro-esophageal reflux disease without esophagitis: Secondary | ICD-10-CM | POA: Diagnosis not present

## 2021-11-03 DIAGNOSIS — E039 Hypothyroidism, unspecified: Secondary | ICD-10-CM | POA: Insufficient documentation

## 2021-11-03 DIAGNOSIS — F17200 Nicotine dependence, unspecified, uncomplicated: Secondary | ICD-10-CM | POA: Insufficient documentation

## 2021-11-03 DIAGNOSIS — I69398 Other sequelae of cerebral infarction: Secondary | ICD-10-CM | POA: Diagnosis not present

## 2021-11-03 DIAGNOSIS — J343 Hypertrophy of nasal turbinates: Secondary | ICD-10-CM | POA: Insufficient documentation

## 2021-11-03 DIAGNOSIS — D869 Sarcoidosis, unspecified: Secondary | ICD-10-CM | POA: Insufficient documentation

## 2021-11-03 HISTORY — PX: NASAL SEPTOPLASTY W/ TURBINOPLASTY: SHX2070

## 2021-11-03 SURGERY — SEPTOPLASTY, NOSE, WITH NASAL TURBINATE REDUCTION
Anesthesia: General | Site: Nose | Laterality: Bilateral

## 2021-11-03 MED ORDER — MEPERIDINE HCL 25 MG/ML IJ SOLN: 6.2500 mg | INTRAMUSCULAR | Status: DC | PRN

## 2021-11-03 MED ORDER — OXYCODONE-ACETAMINOPHEN 5-325 MG PO TABS: 1.0000 | ORAL_TABLET | ORAL | 0 refills | Status: AC | PRN

## 2021-11-03 MED ORDER — LIDOCAINE HCL (CARDIAC) PF 100 MG/5ML IV SOSY
PREFILLED_SYRINGE | INTRAVENOUS | Status: DC | PRN
  Administered 2021-11-03: 50 mg via INTRAVENOUS

## 2021-11-03 MED ORDER — SUCCINYLCHOLINE CHLORIDE 200 MG/10ML IV SOSY
PREFILLED_SYRINGE | INTRAVENOUS | Status: DC | PRN
  Administered 2021-11-03: 80 mg via INTRAVENOUS

## 2021-11-03 MED ORDER — ONDANSETRON HCL 4 MG PO TABS: 4.0000 mg | ORAL_TABLET | Freq: Three times a day (TID) | ORAL | 0 refills | Status: DC | PRN

## 2021-11-03 MED ORDER — ONDANSETRON HCL 4 MG/2ML IJ SOLN
INTRAMUSCULAR | Status: DC | PRN
  Administered 2021-11-03: 4 mg via INTRAVENOUS

## 2021-11-03 MED ORDER — BACITRACIN 500 UNIT/GM EX OINT
TOPICAL_OINTMENT | CUTANEOUS | Status: DC | PRN
  Administered 2021-11-03: 1 via TOPICAL

## 2021-11-03 MED ORDER — DEXAMETHASONE SODIUM PHOSPHATE 4 MG/ML IJ SOLN
INTRAMUSCULAR | Status: DC | PRN
  Administered 2021-11-03: 10 mg via INTRAVENOUS

## 2021-11-03 MED ORDER — HEMOSTATIC AGENTS (NO CHARGE) OPTIME
TOPICAL | Status: DC | PRN
  Administered 2021-11-03: 1 via TOPICAL

## 2021-11-03 MED ORDER — ACETAMINOPHEN 10 MG/ML IV SOLN
1000.0000 mg | Freq: Once | INTRAVENOUS | Status: AC
  Administered 2021-11-03: 09:00:00 1000 mg via INTRAVENOUS

## 2021-11-03 MED ORDER — FENTANYL CITRATE (PF) 100 MCG/2ML IJ SOLN
INTRAMUSCULAR | Status: DC | PRN
  Administered 2021-11-03: 25 ug via INTRAVENOUS
  Administered 2021-11-03: 50 ug via INTRAVENOUS
  Administered 2021-11-03: 25 ug via INTRAVENOUS

## 2021-11-03 MED ORDER — PROPOFOL 10 MG/ML IV BOLUS
INTRAVENOUS | Status: DC | PRN
  Administered 2021-11-03: 130 mg via INTRAVENOUS

## 2021-11-03 MED ORDER — GLYCOPYRROLATE 0.2 MG/ML IJ SOLN
INTRAMUSCULAR | Status: DC | PRN
  Administered 2021-11-03: .1 mg via INTRAVENOUS

## 2021-11-03 MED ORDER — LACTATED RINGERS IV SOLN: INTRAVENOUS | Status: DC

## 2021-11-03 MED ORDER — PROMETHAZINE HCL 25 MG/ML IJ SOLN: 6.2500 mg | INTRAMUSCULAR | Status: DC | PRN

## 2021-11-03 MED ORDER — HYDROMORPHONE HCL 1 MG/ML IJ SOLN: 0.2500 mg | INTRAMUSCULAR | Status: DC | PRN

## 2021-11-03 MED ORDER — OXYMETAZOLINE HCL 0.05 % NA SOLN
NASAL | Status: DC | PRN
  Administered 2021-11-03: 1 via TOPICAL

## 2021-11-03 MED ORDER — STERILE WATER FOR IRRIGATION IR SOLN
Status: DC | PRN
  Administered 2021-11-03: 250 mL

## 2021-11-03 MED ORDER — MIDAZOLAM HCL 5 MG/5ML IJ SOLN
INTRAMUSCULAR | Status: DC | PRN
  Administered 2021-11-03: 2 mg via INTRAVENOUS

## 2021-11-03 MED ORDER — OXYCODONE HCL 5 MG PO TABS: 5.0000 mg | ORAL_TABLET | Freq: Once | ORAL | Status: AC | PRN

## 2021-11-03 MED ORDER — SULFAMETHOXAZOLE-TRIMETHOPRIM 800-160 MG PO TABS: 1.0000 | ORAL_TABLET | Freq: Two times a day (BID) | ORAL | 0 refills | Status: DC

## 2021-11-03 MED ORDER — OXYCODONE HCL 5 MG/5ML PO SOLN
5.0000 mg | Freq: Once | ORAL | Status: AC | PRN
  Administered 2021-11-03: 13:00:00 5 mg via ORAL

## 2021-11-03 MED ORDER — SCOPOLAMINE 1 MG/3DAYS TD PT72
1.0000 | MEDICATED_PATCH | TRANSDERMAL | Status: DC
  Administered 2021-11-03: 09:00:00 1.5 mg via TRANSDERMAL

## 2021-11-03 MED ORDER — LIDOCAINE-EPINEPHRINE 1 %-1:100000 IJ SOLN
INTRAMUSCULAR | Status: DC | PRN
  Administered 2021-11-03: 9.5 mL via INTRADERMAL

## 2021-11-03 SURGICAL SUPPLY — 24 items
CANISTER SUCT 1200ML W/VALVE (MISCELLANEOUS) ×2 IMPLANT
COAG SUCT 10F 3.5MM HAND CTRL (MISCELLANEOUS) ×2 IMPLANT
COAGULATOR SUCT 8FR VV (MISCELLANEOUS) ×1 IMPLANT
DRESSING NASL FOAM PST OP SINU (MISCELLANEOUS) IMPLANT
DRSG NASAL FOAM POST OP SINU (MISCELLANEOUS) ×2
ELECT REM PT RETURN 9FT ADLT (ELECTROSURGICAL) ×2
ELECTRODE REM PT RTRN 9FT ADLT (ELECTROSURGICAL) ×1 IMPLANT
GLOVE SURG GAMMEX PI TX LF 7.5 (GLOVE) ×4 IMPLANT
HEMOSTAT ARISTA ABSORB 3G PWDR (HEMOSTASIS) ×1 IMPLANT
KIT TURNOVER KIT A (KITS) ×2 IMPLANT
NDL HYPO 25GX1X1/2 BEV (NEEDLE) ×1 IMPLANT
NEEDLE HYPO 25GX1X1/2 BEV (NEEDLE) ×2 IMPLANT
PACK ENT CUSTOM (PACKS) ×2 IMPLANT
PATTIES SURGICAL .5 X3 (DISPOSABLE) ×3 IMPLANT
SOL ANTI-FOG 6CC FOG-OUT (MISCELLANEOUS) ×1 IMPLANT
SOL FOG-OUT ANTI-FOG 6CC (MISCELLANEOUS) ×1
SPLINT NASAL SEPTAL BLV .50 ST (MISCELLANEOUS) ×2 IMPLANT
STRAP BODY AND KNEE 60X3 (MISCELLANEOUS) ×2 IMPLANT
SUT CHROMIC 4 0 RB 1X27 (SUTURE) ×2 IMPLANT
SUT ETHILON 3-0 FS-10 30 BLK (SUTURE) ×2
SUTURE EHLN 3-0 FS-10 30 BLK (SUTURE) ×1 IMPLANT
SYR 10ML LL (SYRINGE) ×2 IMPLANT
TOWEL OR 17X26 4PK STRL BLUE (TOWEL DISPOSABLE) ×2 IMPLANT
WATER STERILE IRR 250ML POUR (IV SOLUTION) ×1 IMPLANT

## 2021-11-03 NOTE — Anesthesia Postprocedure Evaluation (Signed)
Anesthesia Post Note  Patient: Kara Kirk  Procedure(s) Performed: NASAL SEPTOPLASTY WITH INFERIOR TURBINATE REDUCTION (Bilateral: Nose)     Patient location during evaluation: PACU Anesthesia Type: General Level of consciousness: awake and alert Pain management: pain level controlled Vital Signs Assessment: post-procedure vital signs reviewed and stable Respiratory status: spontaneous breathing, nonlabored ventilation, respiratory function stable and patient connected to nasal cannula oxygen Cardiovascular status: blood pressure returned to baseline and stable Postop Assessment: no apparent nausea or vomiting Anesthetic complications: no   No notable events documented.  Sully Manzi A  Jayson Waterhouse

## 2021-11-03 NOTE — Anesthesia Procedure Notes (Signed)
Procedure Name: Intubation Date/Time: 11/03/2021 10:47 AM Performed by: Mayme Genta, CRNA Pre-anesthesia Checklist: Patient identified, Emergency Drugs available, Suction available, Patient being monitored and Timeout performed Patient Re-evaluated:Patient Re-evaluated prior to induction Oxygen Delivery Method: Circle system utilized Preoxygenation: Pre-oxygenation with 100% oxygen Induction Type: IV induction Ventilation: Mask ventilation without difficulty Laryngoscope Size: Miller and 2 Grade View: Grade I Tube type: Oral Rae Tube size: 7.0 mm Number of attempts: 1 Placement Confirmation: ETT inserted through vocal cords under direct vision, positive ETCO2 and breath sounds checked- equal and bilateral Tube secured with: Tape Dental Injury: Teeth and Oropharynx as per pre-operative assessment

## 2021-11-03 NOTE — Anesthesia Preprocedure Evaluation (Signed)
Anesthesia Evaluation  Patient identified by MRN, date of birth, ID band Patient awake    Reviewed: Allergy & Precautions, NPO status , Patient's Chart, lab work & pertinent test results, reviewed documented beta blocker date and time   History of Anesthesia Complications (+) PONV and history of anesthetic complications  Airway Mallampati: II  TM Distance: >3 FB Neck ROM: Limited    Dental   Pulmonary Current SmokerPatient did not abstain from smoking.,  Sarcoidosis   breath sounds clear to auscultation       Cardiovascular hypertension, Pt. on medications and Pt. on home beta blockers (-) angina(-) DOE + dysrhythmias  Rhythm:Regular Rate:Normal     Neuro/Psych  Headaches, PSYCHIATRIC DISORDERS Anxiety Depression CVA (1990, residual L sided numbness), Residual Symptoms    GI/Hepatic GERD  , Fatty liver disease   Endo/Other  Hypothyroidism   Renal/GU CRFRenal disease     Musculoskeletal  (+) Arthritis ,   Abdominal   Peds  Hematology   Anesthesia Other Findings   Reproductive/Obstetrics                            Anesthesia Physical Anesthesia Plan  ASA: 3  Anesthesia Plan: General   Post-op Pain Management:    Induction: Intravenous  PONV Risk Score and Plan: 4 or greater and Treatment may vary due to age or medical condition, Ondansetron, Dexamethasone, Midazolam and Scopolamine patch - Pre-op  Airway Management Planned: Oral ETT  Additional Equipment:   Intra-op Plan:   Post-operative Plan: Extubation in OR  Informed Consent: I have reviewed the patients History and Physical, chart, labs and discussed the procedure including the risks, benefits and alternatives for the proposed anesthesia with the patient or authorized representative who has indicated his/her understanding and acceptance.       Plan Discussed with: CRNA and Anesthesiologist  Anesthesia Plan Comments:         Anesthesia Quick Evaluation

## 2021-11-03 NOTE — H&P (Signed)
..  History and Physical paper copy reviewed and updated date of procedure and will be scanned into system.  Patient seen and examined.  

## 2021-11-03 NOTE — Op Note (Signed)
..11/03/2021  12:33 PM    Kara Kirk  [id number]    Pre-Op Dx:  Deviated Nasal Septum, Hypertrophic Inferior Turbinates  Post-op Dx: Same  Proc: Nasal Septoplasty, Bilateral Partial Reduction Inferior Turbinates   Surg:  Arnulfo Batson  Anes:  GOT  EBL:  100  Comp:  none  Findings: severe left sided septal deviation with impaction onto inferior turbinate and lateral nasal side wall.  Bilateral inferior turbinate hypertrophy.  Procedure: With the patient in a comfortable supine position,  general orotracheal anesthesia was induced without difficulty.  The patient received preoperative Afrin spray for topical decongestion and vasoconstriction.  At an appropriate level, the patient was placed in a semi-sitting position.  Nasal vibrissae were trimmed.   1% Xylocaine with 1:100,000 epinephrine, 9 cc's, was infiltrated into the anterior floor of the nose, into the nasal spine region, into the membranous columella, and finally into the submucoperichondrial plane of the septum on both sides.  Several minutes were allowed for this to take effect.  Cottoniod pledgetts soaked in Afrin were placed into both nasal cavities and left while the patient was prepped and draped in the standard fashion.   A proper time-out was performed.    The materials were removed from the nose and observed to be intact and correct in number.  The nose was inspected with a headlight and zero degree endoscope with the findings as described above.  A left Killian incision was sharply executed and carried down to the caudal edge of the quadrangular cartilage with a 15 blade scapel.  A mucoperichondrial flap was elelvated along the quadrangular plate back to the bony-cartilaginous junction using caudal elevator and freer elevator. The mucoperiostium was then elevated along the ethmoid plate and the vomer. An itracartilagenous incision was made using the freer elevator and a contralateral mucoperichondiral flap  was elevated using a freer elevator.  Care was taken to avoid any large rents or opposing rents in the mucoperichondrial flap.  Boney spurs of the vomer and maxillary crest were removed with Takahashi forceps.  The area of cartilagenous deviation was removed with combination of freer elevator and Takahashi forceps creating a widely patent nasal cavity as well as resolution of obstruction from the cartilagenous deviation. The mucosal flaps were placed back into their anatomic position to allow visualization of the airways. The septum now sat in the midline with an improved airway.  A 4-0 Chromic was used to close the [place] incision as well.   The inferior turbinates were then inspected.  Under endoscopic visualization, the inferior turbinates were infractured bilaterally with a Soil scientist.  A kelly clamp was attached to the anterior-inferior third of each inferior turbinate for approximately one minute.  Under endoscopic visualization, Tru-cutting forceps were used to remove the anterior-inferior third of each inferior turbinate.  Electrocautery was used to control bleeding in the area. The remaining turbinate was then outfractured to open up the airway further. There was no significant bleeding noted. The right turbinate was then trimmed and outfractured in a similar fashion.  The airways were then visualized and showed open passageways on both sides that were significantly improved compared to before surgery.       Arista was next dusted into the nasal cavity to aid in continued hemostasis.  Care of the patient at this time was transferred to anesthesia and was extubated and taken to PACU in good condition.  There was no signifcant bleeding. Nasal splints were applied to both sides of the septum using Xomed  0.62mm regular sized splints that were trimmed, and then held in position with a 3-0 Nylon through and through suture.  Stamberger sinufoam was placed along the cut edge of the inferior  turbinates bilaterally.  The patient was turned back over to anesthesia, and awakened, extubated, and taken to the PACU in satisfactory condition.  Dispo:   PACU to home  Plan: Ice, elevation, narcotic analgesia, steroid taper, and prophylactic antibiotics for the duration of indwelling nasal foreign bodies.  We will reevaluate the patient in the office in 6 days and remove the septal splints.  Return to work in 10 days, strenuous activities in two weeks.   [name] [name] 11/03/2021 12:33 PM

## 2021-11-03 NOTE — Transfer of Care (Signed)
Immediate Anesthesia Transfer of Care Note  Patient: Kara Kirk  Procedure(s) Performed: NASAL SEPTOPLASTY WITH INFERIOR TURBINATE REDUCTION (Bilateral: Nose)  Patient Location: PACU  Anesthesia Type: General  Level of Consciousness: awake, alert  and patient cooperative  Airway and Oxygen Therapy: Patient Spontanous Breathing and Patient connected to supplemental oxygen  Post-op Assessment: Post-op Vital signs reviewed, Patient's Cardiovascular Status Stable, Respiratory Function Stable, Patent Airway and No signs of Nausea or vomiting  Post-op Vital Signs: Reviewed and stable  Complications: No notable events documented.

## 2021-11-04 ENCOUNTER — Encounter: Payer: Self-pay | Admitting: Otolaryngology

## 2022-05-17 ENCOUNTER — Other Ambulatory Visit: Payer: Self-pay | Admitting: Family Medicine

## 2022-05-17 DIAGNOSIS — Z1231 Encounter for screening mammogram for malignant neoplasm of breast: Secondary | ICD-10-CM

## 2022-06-13 ENCOUNTER — Ambulatory Visit
Admission: RE | Admit: 2022-06-13 | Discharge: 2022-06-13 | Disposition: A | Discharge status: Home / Self Care | Payer: BC Managed Care – PPO | Source: Ambulatory Visit | Attending: Family Medicine | Admitting: Family Medicine

## 2022-06-13 DIAGNOSIS — Z1231 Encounter for screening mammogram for malignant neoplasm of breast: Secondary | ICD-10-CM | POA: Diagnosis present

## 2022-06-16 ENCOUNTER — Other Ambulatory Visit: Payer: Self-pay | Admitting: Family Medicine

## 2022-06-16 DIAGNOSIS — R928 Other abnormal and inconclusive findings on diagnostic imaging of breast: Secondary | ICD-10-CM

## 2022-06-16 DIAGNOSIS — N6489 Other specified disorders of breast: Secondary | ICD-10-CM

## 2022-06-16 DIAGNOSIS — R921 Mammographic calcification found on diagnostic imaging of breast: Secondary | ICD-10-CM

## 2022-07-01 ENCOUNTER — Ambulatory Visit
Admission: RE | Admit: 2022-07-01 | Discharge: 2022-07-01 | Disposition: A | Discharge status: Home / Self Care | Payer: BC Managed Care – PPO | Source: Ambulatory Visit | Attending: Family Medicine | Admitting: Family Medicine

## 2022-07-01 DIAGNOSIS — R928 Other abnormal and inconclusive findings on diagnostic imaging of breast: Secondary | ICD-10-CM | POA: Diagnosis present

## 2022-07-01 DIAGNOSIS — N6489 Other specified disorders of breast: Secondary | ICD-10-CM | POA: Insufficient documentation

## 2022-07-01 DIAGNOSIS — R921 Mammographic calcification found on diagnostic imaging of breast: Secondary | ICD-10-CM | POA: Insufficient documentation

## 2022-11-24 ENCOUNTER — Ambulatory Visit: Payer: BC Managed Care – PPO | Admitting: Dermatology

## 2022-11-24 VITALS — BP 132/78

## 2022-11-24 DIAGNOSIS — Z1283 Encounter for screening for malignant neoplasm of skin: Secondary | ICD-10-CM

## 2022-11-24 DIAGNOSIS — L814 Other melanin hyperpigmentation: Secondary | ICD-10-CM | POA: Diagnosis not present

## 2022-11-24 DIAGNOSIS — L719 Rosacea, unspecified: Secondary | ICD-10-CM | POA: Diagnosis not present

## 2022-11-24 DIAGNOSIS — D229 Melanocytic nevi, unspecified: Secondary | ICD-10-CM

## 2022-11-24 DIAGNOSIS — L578 Other skin changes due to chronic exposure to nonionizing radiation: Secondary | ICD-10-CM

## 2022-11-24 DIAGNOSIS — L821 Other seborrheic keratosis: Secondary | ICD-10-CM

## 2022-11-24 DIAGNOSIS — D692 Other nonthrombocytopenic purpura: Secondary | ICD-10-CM

## 2022-11-24 DIAGNOSIS — L820 Inflamed seborrheic keratosis: Secondary | ICD-10-CM

## 2022-11-24 DIAGNOSIS — Z7189 Other specified counseling: Secondary | ICD-10-CM

## 2022-11-24 NOTE — Patient Instructions (Addendum)
Melanoma ABCDEs  Melanoma is the most dangerous type of skin cancer, and is the leading cause of death from skin disease.  You are more likely to develop melanoma if you: Have light-colored skin, light-colored eyes, or red or blond hair Spend a lot of time in the sun Tan regularly, either outdoors or in a tanning bed Have had blistering sunburns, especially during childhood Have a close family member who has had a melanoma Have atypical moles or large birthmarks  Early detection of melanoma is key since treatment is typically straightforward and cure rates are extremely high if we catch it early.   The first sign of melanoma is often a change in a mole or a new dark spot.  The ABCDE system is a way of remembering the signs of melanoma.  A for asymmetry:  The two halves do not match. B for border:  The edges of the growth are irregular. C for color:  A mixture of colors are present instead of an even brown color. D for diameter:  Melanomas are usually (but not always) greater than 72m - the size of a pencil eraser. E for evolution:  The spot keeps changing in size, shape, and color.  Please check your skin once per month between visits. You can use a small mirror in front and a large mirror behind you to keep an eye on the back side or your body.   If you see any new or changing lesions before your next follow-up, please call to schedule a visit.  Please continue daily skin protection including broad spectrum sunscreen SPF 30+ to sun-exposed areas, reapplying every 2 hours as needed when you're outdoors.   Staying in the shade or wearing long sleeves, sun glasses (UVA+UVB protection) and wide brim hats (4-inch brim around the entire circumference of the hat) are also recommended for sun protection.    Seborrheic Keratosis  What causes seborrheic keratoses? Seborrheic keratoses are harmless, common skin growths that first appear during adult life.  As time goes by, more growths appear.   Some people may develop a large number of them.  Seborrheic keratoses appear on both covered and uncovered body parts.  They are not caused by sunlight.  The tendency to develop seborrheic keratoses can be inherited.  They vary in color from skin-colored to gray, brown, or even black.  They can be either smooth or have a rough, warty surface.   Seborrheic keratoses are superficial and look as if they were stuck on the skin.  Under the microscope this type of keratosis looks like layers upon layers of skin.  That is why at times the top layer may seem to fall off, but the rest of the growth remains and re-grows.    Treatment Seborrheic keratoses do not need to be treated, but can easily be removed in the office.  Seborrheic keratoses often cause symptoms when they rub on clothing or jewelry.  Lesions can be in the way of shaving.  If they become inflamed, they can cause itching, soreness, or burning.  Removal of a seborrheic keratosis can be accomplished by freezing, burning, or surgery. If any spot bleeds, scabs, or grows rapidly, please return to have it checked, as these can be an indication of a skin cancer. Due to recent changes in healthcare laws, you may see results of your pathology and/or laboratory studies on MyChart before the doctors have had a chance to review them. We understand that in some cases there may be results  be results that are confusing or concerning to you. Please understand that not all results are received at the same time and often the doctors may need to interpret multiple results in order to provide you with the best plan of care or course of treatment. Therefore, we ask that you please give us 2 business days to thoroughly review all your results before contacting the office for clarification. Should we see a critical lab result, you will be contacted sooner.   If You Need Anything After Your Visit  If you have any questions or concerns for your doctor, please call our main  line at [phone] and press option 4 to reach your doctor's medical assistant. If no one answers, please leave a voicemail as directed and we will return your call as soon as possible. Messages left after 4 pm will be answered the following business day.   You may also send us a message via MyChart. We typically respond to MyChart messages within 1-2 business days.  For prescription refills, please ask your pharmacy to contact our office. Our fax number is [phone].  If you have an urgent issue when the clinic is closed that cannot wait until the next business day, you can page your doctor at the number below.    Please note that while we do our best to be available for urgent issues outside of office hours, we are not available 24/7.   If you have an urgent issue and are unable to reach us, you may choose to seek medical care at your doctor's office, retail clinic, urgent care center, or emergency room.  If you have a medical emergency, please immediately call 911 or go to the emergency department.  Pager Numbers  - Dr. Kowalski: [phone]  - Dr. Moye: [phone]  - Dr. Stewart: [phone]  In the event of inclement weather, please call our main line at [phone] for an update on the status of any delays or closures.  Dermatology Medication Tips: Please keep the boxes that topical medications come in in order to help keep track of the instructions about where and how to use these. Pharmacies typically print the medication instructions only on the boxes and not directly on the medication tubes.   If your medication is too expensive, please contact our office at [phone] option 4 or send us a message through MyChart.   We are unable to tell what your co-pay for medications will be in advance as this is different depending on your insurance coverage. However, we may be able to find a substitute medication at lower cost or fill out paperwork to get insurance to cover a  needed medication.   If a prior authorization is required to get your medication covered by your insurance company, please allow us 1-2 business days to complete this process.  Drug prices often vary depending on where the prescription is filled and some pharmacies may offer cheaper prices.  The website www.goodrx.com contains coupons for medications through different pharmacies. The prices here do not account for what the cost may be with help from insurance (it may be cheaper with your insurance), but the website can give you the price if you did not use any insurance.  - You can print the associated coupon and take it with your prescription to the pharmacy.  - You may also stop by our office during regular business hours and pick up a GoodRx coupon card.  - If you need your prescription sent electronically to a   different pharmacy, notify our office through [facility] MyChart or by phone at [phone] option 4.     Si Usted Necesita Algo Despus de Su Visita  Tambin puede enviarnos un mensaje a travs de MyChart. Por lo general respondemos a los mensajes de MyChart en el transcurso de 1 a 2 das hbiles.  Para renovar recetas, por favor pida a su farmacia que se ponga en contacto con nuestra oficina. Nuestro nmero de fax es el [phone].  Si tiene un asunto urgente cuando la clnica est cerrada y que no puede esperar hasta el siguiente da hbil, puede llamar/localizar a su doctor(a) al nmero que aparece a continuacin.   Por favor, tenga en cuenta que aunque hacemos todo lo posible para estar disponibles para asuntos urgentes fuera del horario de oficina, no estamos disponibles las 24 horas del da, los 7 das de la semana.   Si tiene un problema urgente y no puede comunicarse con nosotros, puede optar por buscar atencin mdica  en el consultorio de su doctor(a), en una clnica privada, en un centro de atencin urgente o en una sala de emergencias.  Si tiene una emergencia  mdica, por favor llame inmediatamente al 911 o vaya a la sala de emergencias.  Nmeros de bper  - Dr. Kowalski: [phone]  - Dra. Moye: [phone]  - Dra. Stewart: [phone]  En caso de inclemencias del tiempo, por favor llame a nuestra lnea principal al [phone] para una actualizacin sobre el estado de cualquier retraso o cierre.  Consejos para la medicacin en dermatologa: Por favor, guarde las cajas en las que vienen los medicamentos de uso tpico para ayudarle a seguir las instrucciones sobre dnde y cmo usarlos. Las farmacias generalmente imprimen las instrucciones del medicamento slo en las cajas y no directamente en los tubos del medicamento.   Si su medicamento es muy caro, por favor, pngase en contacto con nuestra oficina llamando al [phone] y presione la opcin 4 o envenos un mensaje a travs de MyChart.   No podemos decirle cul ser su copago por los medicamentos por adelantado ya que esto es diferente dependiendo de la cobertura de su seguro. Sin embargo, es posible que podamos encontrar un medicamento sustituto a menor costo o llenar un formulario para que el seguro cubra el medicamento que se considera necesario.   Si se requiere una autorizacin previa para que su compaa de seguros cubra su medicamento, por favor permtanos de 1 a 2 das hbiles para completar este proceso.  Los precios de los medicamentos varan con frecuencia dependiendo del lugar de dnde se surte la receta y alguna farmacias pueden ofrecer precios ms baratos.  El sitio web www.goodrx.com tiene cupones para medicamentos de diferentes farmacias. Los precios aqu no tienen en cuenta lo que podra costar con la ayuda del seguro (puede ser ms barato con su seguro), pero el sitio web puede darle el precio si no utiliz ningn seguro.  - Puede imprimir el cupn correspondiente y llevarlo con su receta a la farmacia.  - Tambin puede pasar por nuestra oficina durante el horario de  atencin regular y recoger una tarjeta de cupones de GoodRx.  - Si necesita que su receta se enve electrnicamente a una farmacia diferente, informe a nuestra oficina a travs de MyChart de [place] o por telfono llamando al [phone] y presione la opcin 4.  

## 2022-11-24 NOTE — Progress Notes (Signed)
New Patient Visit  Subjective  Kara Kirk is a 62 y.o. female who presents for the following: Annual Exam. The patient presents for Total-Body Skin Exam (TBSE) for skin cancer screening and mole check.  The patient has spots, moles and lesions to be evaluated, some may be new or changing and the patient has concerns that these could be cancer. No history of skin cancer.   The following portions of the chart were reviewed this encounter and updated as appropriate:   Tobacco  Allergies  Meds  Problems  Med Hx  Surg Hx  Fam Hx     Review of Systems:  No other skin or systemic complaints except as noted in HPI or Assessment and Plan.  Objective  Well appearing patient in no apparent distress; mood and affect are within normal limits.  A full examination was performed including scalp, head, eyes, ears, nose, lips, neck, chest, axillae, abdomen, back, buttocks, bilateral upper extremities, bilateral lower extremities, hands, feet, fingers, toes, fingernails, and toenails. All findings within normal limits unless otherwise noted below.  face Mid face and nose erythema with telangiectasias  Left Anterior Lateral Neck Erythematous stuck-on, waxy papule or plaque   Assessment & Plan  Skin cancer screening performed today.  Actinic Damage - chronic, secondary to cumulative UV radiation exposure/sun exposure over time - diffuse scaly erythematous macules with underlying dyspigmentation - Recommend daily broad spectrum sunscreen SPF 30+ to sun-exposed areas, reapply every 2 hours as needed.  - Recommend staying in the shade or wearing long sleeves, sun glasses (UVA+UVB protection) and wide brim hats (4-inch brim around the entire circumference of the hat). - Call for new or changing lesions.  Lentigines - Scattered tan macules - Due to sun exposure - Benign-appearing, observe - Recommend daily broad spectrum sunscreen SPF 30+ to sun-exposed areas, reapply every 2 hours as  needed. - Call for any changes  Seborrheic Keratoses - Stuck-on, waxy, tan-brown papules and/or plaques - Benign-appearing - Discussed benign etiology and prognosis. - Observe - Call for any changes  Melanocytic Nevi - Tan-brown and/or pink-flesh-colored symmetric macules and papules - Benign appearing on exam today - Observation - Call clinic for new or changing moles - Recommend daily use of broad spectrum spf 30+ sunscreen to sun-exposed areas.   Purpura - Chronic; persistent and recurrent.  Treatable, but not curable. 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  No history of any skin sarcoidosis  Rosacea face Rosacea is a chronic progressive skin condition usually affecting the face of adults, causing redness and/or acne bumps. It is treatable but not curable. It sometimes affects the eyes (ocular rosacea) as well. It may respond to topical and/or systemic medication and can flare with stress, sun exposure, alcohol, exercise, topical steroids (including hydrocortisone/cortisone 10) and some foods.  Daily application of broad spectrum spf 30+ sunscreen to face is recommended to reduce flares. Counseling for BBL / IPL / Laser and Coordination of Care Discussed the treatment option of Broad Band Light (BBL) Intense Pulsed Light (IPL) / Laser.  Typically we recommend at least 1-3 treatment sessions about 5-8 weeks apart for best results.  The patient's condition may require "maintenance treatments" in the  future.  The fee for BBL / laser treatments is $350 per treatment session for the whole face.  A fee can be quoted for other parts of the body. Insurance typically does not pay for  BBL/laser treatments and therefore the fee is an out-of-pocket cost.  Inflamed seborrheic keratosis Left Anterior Lateral Neck Symptomatic, irritating, patient would like treated. If still present in 3 months, recommend patient RTC. If no changes, recommend biopsy. Destruction of lesion - Left Anterior Lateral Neck Complexity: simple   Destruction method: cryotherapy   Informed consent: discussed and consent obtained   Timeout:  patient name, date of birth, surgical site, and procedure verified Lesion destroyed using liquid nitrogen: Yes   Region frozen until ice ball extended beyond lesion: Yes   Outcome: patient tolerated procedure well with no complications   Post-procedure details: wound care instructions given    Return in about 1 year (around 11/25/2023) for TBSE.  Lindi Adie, CMA, am acting as scribe for Sarina Ser, MD . Documentation: I have reviewed the above documentation for accuracy and completeness, and I agree with the above.  Sarina Ser, MD

## 2022-11-25 ENCOUNTER — Encounter: Payer: Self-pay | Admitting: Dermatology

## 2022-11-25 ENCOUNTER — Other Ambulatory Visit: Payer: Self-pay | Admitting: Family Medicine

## 2022-11-25 DIAGNOSIS — R921 Mammographic calcification found on diagnostic imaging of breast: Secondary | ICD-10-CM

## 2022-11-30 DIAGNOSIS — G44229 Chronic tension-type headache, not intractable: Secondary | ICD-10-CM | POA: Insufficient documentation

## 2022-12-13 IMAGING — MG MM DIGITAL SCREENING BILAT W/ TOMO AND CAD
8 series · 8 of 24 positions shown · non-contrast
Comparison: Previous exam(s).

CLINICAL DATA: Screening.

EXAM:
DIGITAL SCREENING BILATERAL MAMMOGRAM WITH TOMOSYNTHESIS AND CAD
TECHNIQUE: Bilateral screening digital craniocaudal and mediolateral oblique
mammograms were obtained. Bilateral screening digital breast
tomosynthesis was performed. The images were evaluated with
computer-aided detection.

[R CC synth-2D]
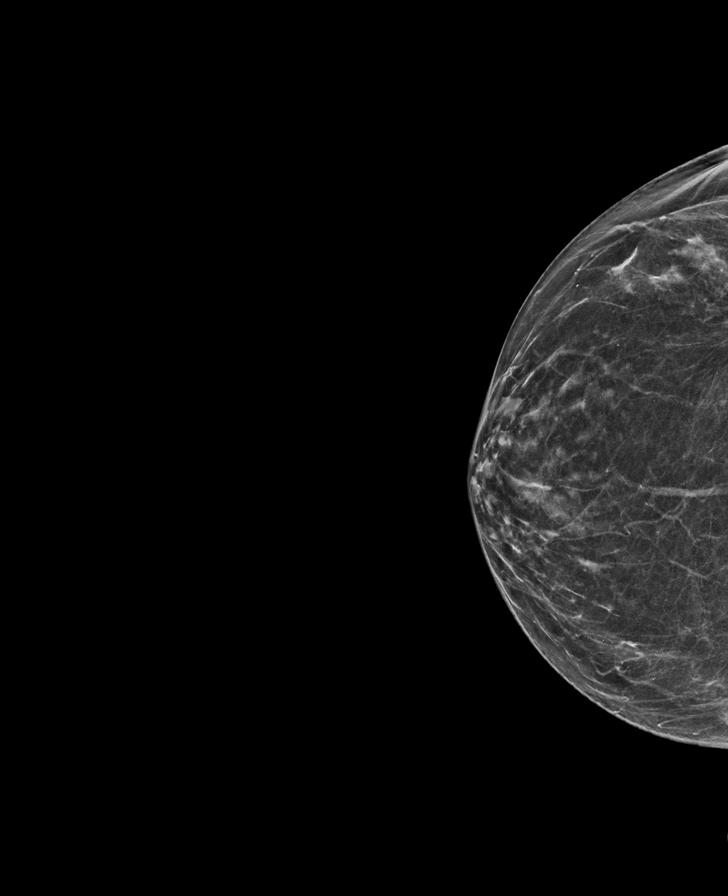

[R MLO synth-2D]
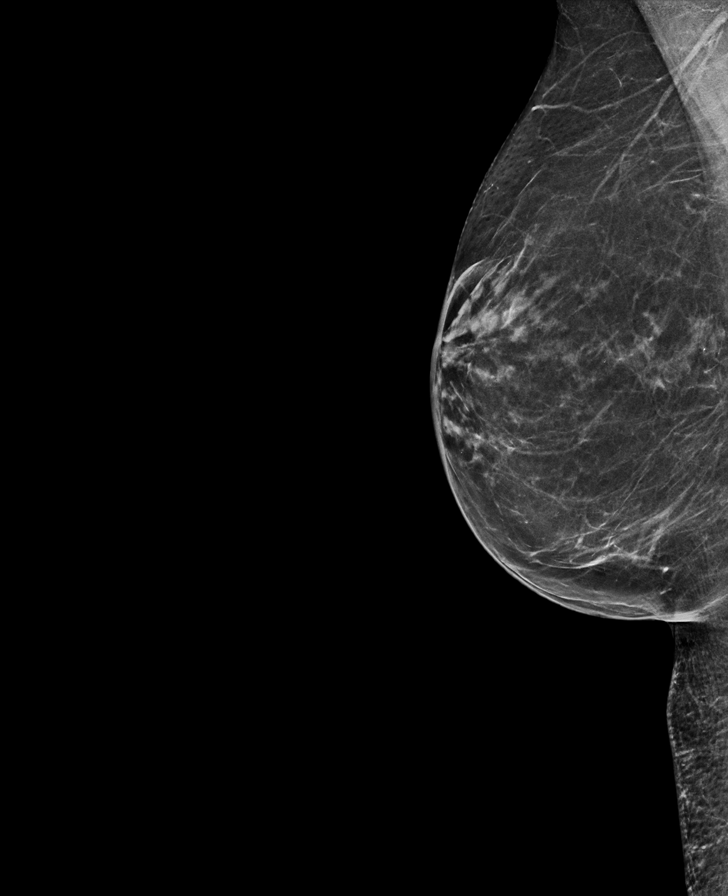

[L MLO synth-2D]
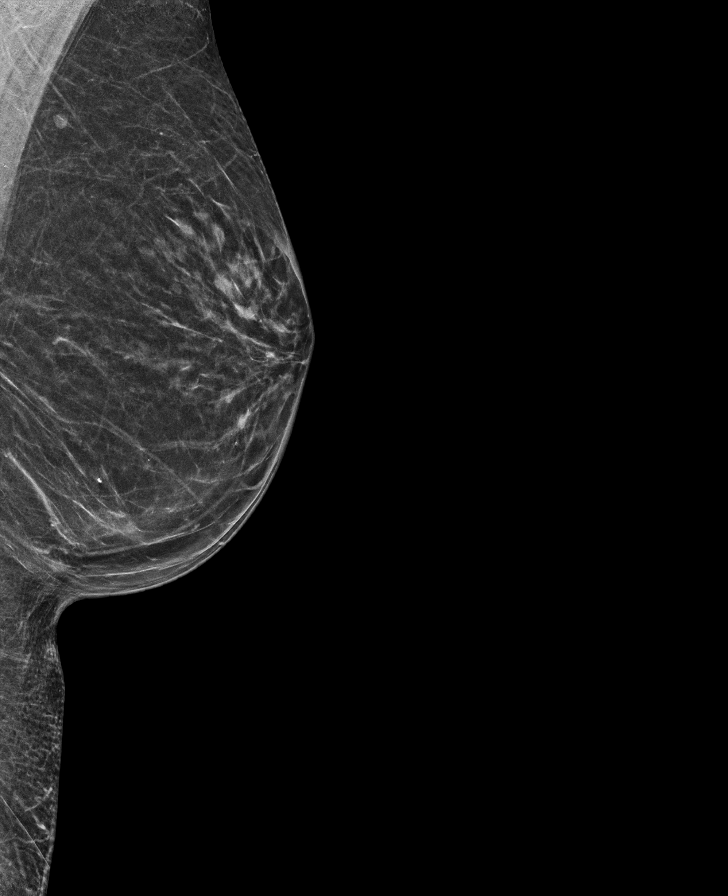

[L CC synth-2D]
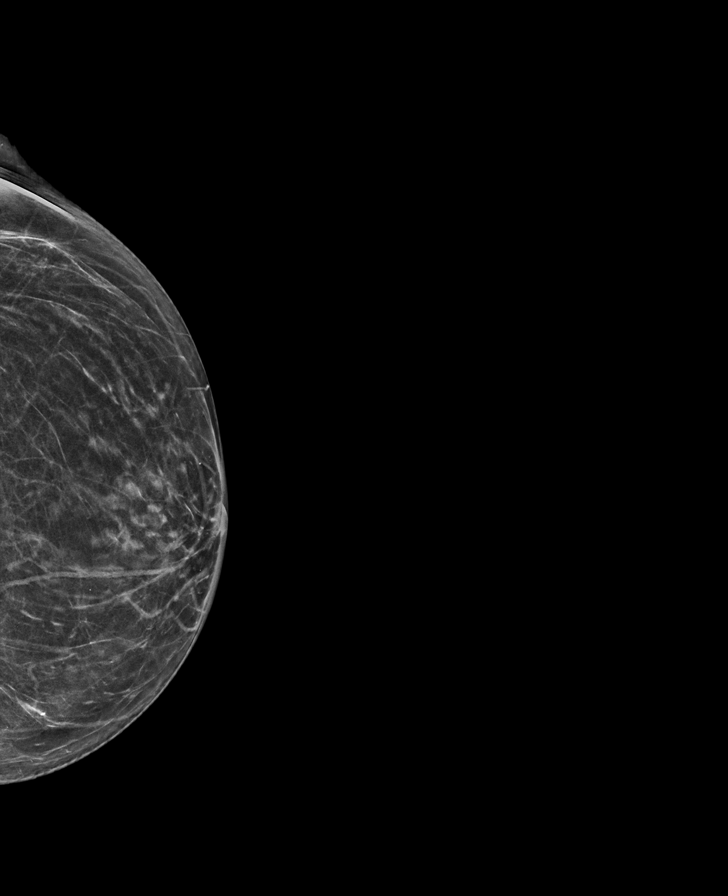

[R CC tomo · tomo slice 27/54.0]
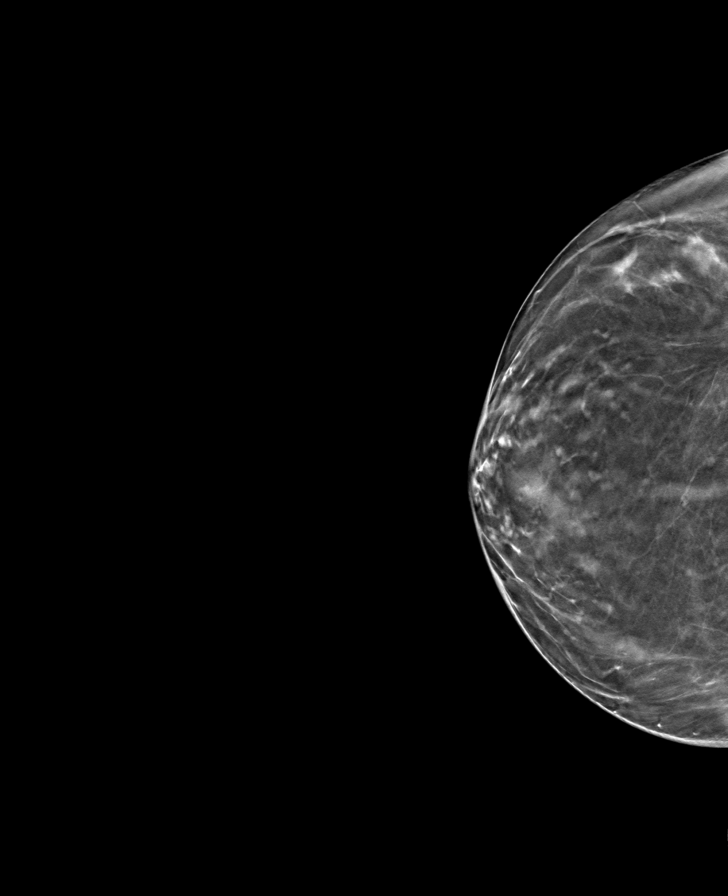

[L MLO tomo · tomo slice 25/50.0]
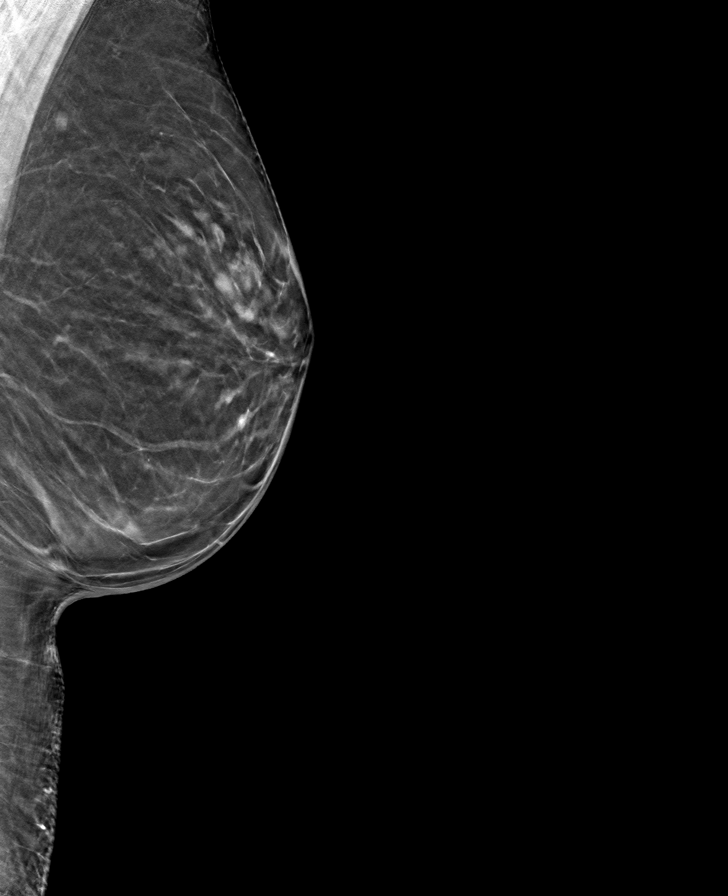

[L CC tomo · tomo slice 27/54.0]
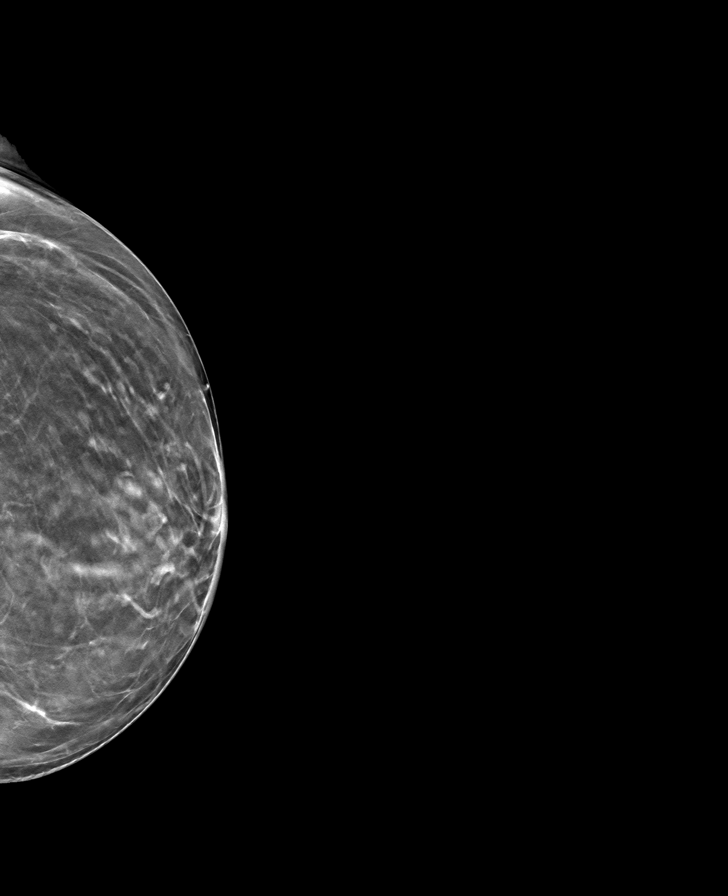

[R MLO tomo · tomo slice 29/58.0]
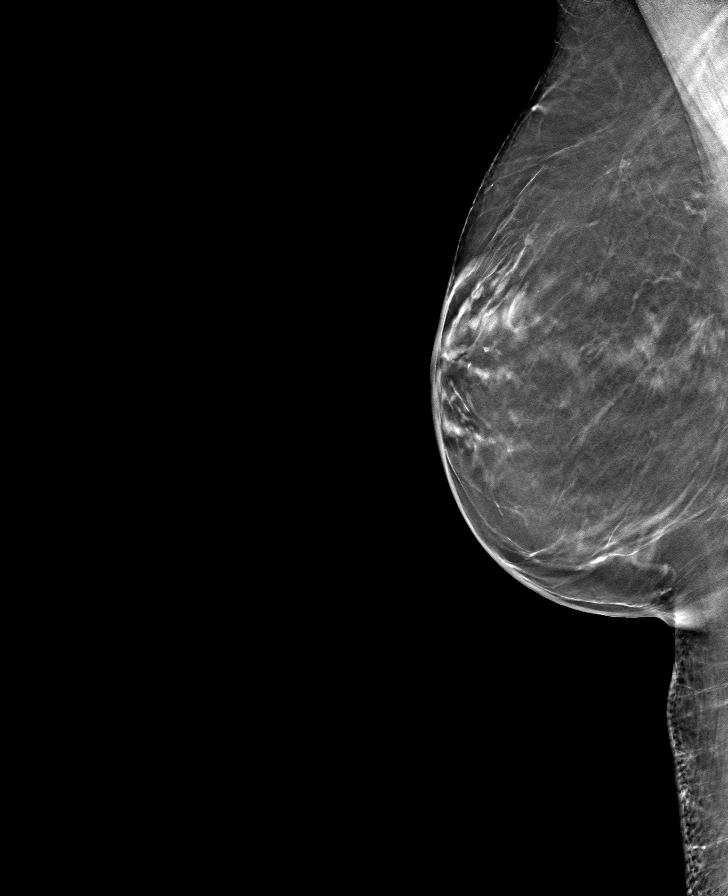

[8 of 24 positions shown; findings below may reference images not displayed]

ACR Breast Density Category b: There are scattered areas of
fibroglandular density.
FINDINGS: There are no findings suspicious for malignancy.
IMPRESSION: No mammographic evidence of malignancy. A result letter of this
screening mammogram will be mailed directly to the patient.

RECOMMENDATION:
Screening mammogram in one year. (Code:51-O-LD2)

BI-RADS CATEGORY  1: Negative.

## 2023-01-03 ENCOUNTER — Ambulatory Visit
Admission: RE | Admit: 2023-01-03 | Discharge: 2023-01-03 | Disposition: A | Discharge status: Home / Self Care | Payer: BC Managed Care – PPO | Source: Ambulatory Visit | Attending: Family Medicine | Admitting: Family Medicine

## 2023-01-03 DIAGNOSIS — R921 Mammographic calcification found on diagnostic imaging of breast: Secondary | ICD-10-CM | POA: Diagnosis present

## 2023-04-10 ENCOUNTER — Other Ambulatory Visit: Payer: Self-pay | Admitting: Family Medicine

## 2023-04-10 DIAGNOSIS — R921 Mammographic calcification found on diagnostic imaging of breast: Secondary | ICD-10-CM

## 2023-06-15 ENCOUNTER — Ambulatory Visit
Admission: RE | Admit: 2023-06-15 | Discharge: 2023-06-15 | Disposition: A | Discharge status: Home / Self Care | Payer: BC Managed Care – PPO | Source: Ambulatory Visit | Attending: Family Medicine | Admitting: Family Medicine

## 2023-06-15 DIAGNOSIS — R921 Mammographic calcification found on diagnostic imaging of breast: Secondary | ICD-10-CM | POA: Diagnosis present

## 2023-08-22 ENCOUNTER — Ambulatory Visit: Payer: BC Managed Care – PPO | Admitting: Dermatology

## 2023-08-22 ENCOUNTER — Encounter: Payer: Self-pay | Admitting: Dermatology

## 2023-08-22 DIAGNOSIS — Z7189 Other specified counseling: Secondary | ICD-10-CM

## 2023-08-22 DIAGNOSIS — L719 Rosacea, unspecified: Secondary | ICD-10-CM

## 2023-08-22 DIAGNOSIS — E890 Postprocedural hypothyroidism: Secondary | ICD-10-CM | POA: Insufficient documentation

## 2023-08-22 MED ORDER — METRONIDAZOLE 0.75 % EX CREA: TOPICAL_CREAM | Freq: Two times a day (BID) | CUTANEOUS | 5 refills | Status: AC

## 2023-08-22 MED ORDER — DOXYCYCLINE HYCLATE 20 MG PO TABS: 20.0000 mg | ORAL_TABLET | Freq: Two times a day (BID) | ORAL | 1 refills | Status: AC

## 2023-08-22 NOTE — Progress Notes (Signed)
   Follow Up Visit   Subjective  Kara Kirk is a 62 y.o. female who presents for the following: Patient c/o a rash on the right side of her face for 2 months, tried Hydrocortisone cream with a poor response.  Hx of Rosacea    The following portions of the chart were reviewed this encounter and updated as appropriate: medications, allergies, medical history  Review of Systems:  No other skin or systemic complaints except as noted in HPI or Assessment and Plan.  Objective  Well appearing patient in no apparent distress; mood and affect are within normal limits.  A focused examination was performed of the following areas:face   Relevant exam findings are noted in the Assessment and Plan.         Assessment & Plan  Rosacea  Related Medications doxycycline (PERIOSTAT) 20 MG tablet Take 1 tablet (20 mg total) by mouth 2 (two) times daily.   ROSACEA Exam: central facial telangectasia with pink inflamed papules   Chronic and persistent condition with duration or expected duration over one year. Condition is bothersome/symptomatic for patient. Currently flared.   Rosacea is a chronic progressive skin condition usually affecting the face of adults, causing redness and/or acne bumps. It is treatable but not curable. It sometimes affects the eyes (ocular rosacea) as well. It may respond to topical and/or systemic medication and can flare with stress, sun exposure, alcohol, exercise, topical steroids (including hydrocortisone/cortisone 10) and some foods.  Daily application of broad spectrum spf 30+ sunscreen to face is recommended to reduce flares.   Treatment Plan:  Start [place] [phone] AZELAIC 15%/ IVERMECTIN 1%/ METRONIDA 1% (TRIPLE ROSACEA) CREAM 30 grams $45  Start doxycycline 20 mg BID x 30 days, 1 refill Recommend BBL with Dr Gwen Pounds   Counseling for BBL / IPL / Laser and Coordination of Care Discussed the treatment option of Broad Band Light (BBL) Windell Moulding  Pulsed Light (IPL)/ Laser for skin discoloration, including brown spots and redness.  Typically we recommend at least 1-3 treatment sessions about 5-8 weeks apart for best results.  Cannot have tanned skin when BBL performed, and regular use of sunscreen/photoprotection is advised after the procedure to help maintain results. The patient's condition may also require "maintenance treatments" in the future.  The fee for BBL / laser treatments is $350 per treatment session for the whole face.  A fee can be quoted for other parts of the body.  Insurance typically does not pay for BBL/laser treatments and therefore the fee is an out-of-pocket cost. Recommend prophylactic valtrex treatment. Once scheduled for procedure, will send Rx in prior to patient's appointment. ;    Return in about 3 months (around 11/22/2023) for Rosacea .  IAngelique Holm, CMA, am acting as scribe for Elie Goody, MD .   Documentation: I have reviewed the above documentation for accuracy and completeness, and I agree with the above.  Elie Goody, MD

## 2023-08-22 NOTE — Patient Instructions (Addendum)
[facility]  [address] [place], [place] [address]  Phone: [phone]     Counseling for BBL / IPL / Laser and Coordination of Care Discussed the treatment option of Broad Band Light (BBL) [place] Pulsed Light (IPL)/ Laser for skin discoloration, including brown spots and redness.  Typically we recommend at least 1-3 treatment sessions about 5-8 weeks apart for best results.  Cannot have tanned skin when BBL performed, and regular use of sunscreen/photoprotection is advised after the procedure to help maintain results. The patient's condition may also require "maintenance treatments" in the future.  The fee for BBL / laser treatments is $350 per treatment session for the whole face.  A fee can be quoted for other parts of the body.  Insurance typically does not pay for BBL/laser treatments and therefore the fee is an out-of-pocket cost. Recommend prophylactic valtrex treatment. Once scheduled for procedure, will send Rx in prior to patient's appointment.     Due to recent changes in healthcare laws, you may see results of your pathology and/or laboratory studies on MyChart before the doctors have had a chance to review them. We understand that in some cases there may be results that are confusing or concerning to you. Please understand that not all results are received at the same time and often the doctors may need to interpret multiple results in order to provide you with the best plan of care or course of treatment. Therefore, we ask that you please give [place] 2 business days to thoroughly review all your results before contacting the office for clarification. Should we see a critical lab result, you will be contacted sooner.   If You Need Anything After Your Visit  If you have any questions or concerns for your doctor, please call our main line at [phone] and press option 4 to reach your doctor's medical assistant. If no one answers, please leave a voicemail as directed and we  will return your call as soon as possible. Messages left after 4 pm will be answered the following business day.   You may also send [place] a message via MyChart. We typically respond to MyChart messages within 1-2 business days.  For prescription refills, please ask your pharmacy to contact our office. Our fax number is [phone].  If you have an urgent issue when the clinic is closed that cannot wait until the next business day, you can page your doctor at the number below.    Please note that while we do our best to be available for urgent issues outside of office hours, we are not available 24/7.   If you have an urgent issue and are unable to reach [place], you may choose to seek medical care at your doctor's office, retail clinic, urgent care center, or emergency room.  If you have a medical emergency, please immediately call 911 or go to the emergency department.  Pager Numbers  - Dr. Gwen Pounds: [phone]  - Dr. Roseanne Reno: [phone]  - Dr. Katrinka Blazing: [phone]   In the event of inclement weather, please call our main line at [phone] for an update on the status of any delays or closures.  Dermatology Medication Tips: Please keep the boxes that topical medications come in in order to help keep track of the instructions about where and how to use these. Pharmacies typically print the medication instructions only on the boxes and not directly on the medication tubes.   If your medication is too expensive, please contact our office at [phone] option  4 or send [place] a message through MyChart.   We are unable to tell what your co-pay for medications will be in advance as this is different depending on your insurance coverage. However, we may be able to find a substitute medication at lower cost or fill out paperwork to get insurance to cover a needed medication.   If a prior authorization is required to get your medication covered by your insurance company, please allow [place] 1-2  business days to complete this process.  Drug prices often vary depending on where the prescription is filled and some pharmacies may offer cheaper prices.  The website www.goodrx.com contains coupons for medications through different pharmacies. The prices here do not account for what the cost may be with help from insurance (it may be cheaper with your insurance), but the website can give you the price if you did not use any insurance.  - You can print the associated coupon and take it with your prescription to the pharmacy.  - You may also stop by our office during regular business hours and pick up a GoodRx coupon card.  - If you need your prescription sent electronically to a different pharmacy, notify our office through [facility] or by phone at [phone] option 4.     Si Usted Necesita Algo Despus de Su Visita  Tambin puede enviarnos un mensaje a travs de Clinical cytogeneticist. Por lo general respondemos a los mensajes de MyChart en el transcurso de 1 a 2 das hbiles.  Para renovar recetas, por favor pida a su farmacia que se ponga en contacto con nuestra oficina. Annie Sable de fax es [place] [phone].  Si tiene un asunto urgente cuando la clnica est cerrada y que no puede esperar hasta el siguiente da hbil, puede llamar/localizar a su doctor(a) al nmero que aparece a continuacin.   Por favor, tenga en cuenta que aunque hacemos todo lo posible para estar disponibles para asuntos urgentes fuera del horario de [place], no estamos disponibles las 24 horas del da, los 7 [address]  [address] de la [place].   Si tiene un problema urgente y no puede comunicarse con nosotros, puede optar por buscar atencin mdica  en el consultorio de su doctor(a), en una clnica privada, en un centro de atencin urgente o en una sala de emergencias.  Si tiene Engineer, drilling, por favor llame inmediatamente al 911 o vaya a la sala de emergencias.  Nmeros de bper  - Dr. Gwen Pounds: [phone]  - Dra.  Roseanne Reno: [phone]  - Dr. Katrinka Blazing: [phone]   En caso de inclemencias del tiempo, por favor llame a Lacy Duverney principal al [phone] para una actualizacin sobre el [place] de cualquier retraso o cierre.  Consejos para la medicacin en dermatologa: Por favor, guarde las cajas en las que vienen los medicamentos de uso tpico para ayudarle a seguir las instrucciones sobre dnde y cmo usarlos. Las farmacias generalmente imprimen las instrucciones del medicamento slo en las cajas y no directamente en los tubos del [place].   Si su medicamento es muy caro, por favor, pngase en contacto con Rolm Gala llamando al [phone] y presione la opcin 4 o envenos un mensaje a travs de Clinical cytogeneticist.   No podemos decirle cul ser su copago por los medicamentos por adelantado ya que esto es diferente dependiendo de la cobertura de su seguro. Sin embargo, es posible que podamos encontrar un medicamento sustituto a Audiological scientist un formulario para que el seguro cubra el medicamento que se considera necesario.  Si se requiere una autorizacin previa para que su compaa de seguros [place] su medicamento, por favor permtanos de 1 a 2 das hbiles para completar [address]  Los precios de los medicamentos varan con frecuencia dependiendo del Environmental consultant de dnde se surte la receta y alguna farmacias pueden ofrecer precios ms baratos.  El sitio web www.goodrx.com tiene cupones para medicamentos de Health and safety inspector. Los precios aqu no tienen en cuenta lo que podra costar con la ayuda del seguro (puede ser ms barato con su seguro), pero el sitio web puede darle el precio si no utiliz Tourist information centre manager.  - Puede imprimir el cupn correspondiente y llevarlo con su receta a la farmacia.  - Tambin puede pasar por nuestra oficina durante el horario de atencin regular y Education officer, museum una tarjeta de cupones de GoodRx.  - Si necesita que su receta se enve electrnicamente a una farmacia diferente,  informe a nuestra oficina a travs de MyChart de [place] o por telfono llamando al [phone] y presione la opcin 4.

## 2023-09-06 ENCOUNTER — Encounter: Payer: Self-pay | Admitting: Internal Medicine

## 2023-09-06 DIAGNOSIS — I351 Nonrheumatic aortic (valve) insufficiency: Secondary | ICD-10-CM

## 2023-09-07 ENCOUNTER — Other Ambulatory Visit: Payer: Self-pay | Admitting: Internal Medicine

## 2023-09-07 DIAGNOSIS — I351 Nonrheumatic aortic (valve) insufficiency: Secondary | ICD-10-CM

## 2023-09-13 ENCOUNTER — Ambulatory Visit
Admission: RE | Admit: 2023-09-13 | Discharge: 2023-09-13 | Disposition: A | Discharge status: Home / Self Care | Payer: BC Managed Care – PPO | Source: Ambulatory Visit | Attending: Internal Medicine | Admitting: Internal Medicine

## 2023-09-13 DIAGNOSIS — I351 Nonrheumatic aortic (valve) insufficiency: Secondary | ICD-10-CM

## 2023-11-27 ENCOUNTER — Ambulatory Visit: Payer: 59 | Admitting: Dermatology

## 2023-11-30 ENCOUNTER — Ambulatory Visit: Payer: BC Managed Care – PPO | Admitting: Dermatology

## 2023-12-18 ENCOUNTER — Ambulatory Visit: Payer: 59 | Admitting: Dermatology

## 2024-01-10 ENCOUNTER — Ambulatory Visit: Payer: 59 | Admitting: Dermatology

## 2024-01-11 ENCOUNTER — Ambulatory Visit: Payer: BC Managed Care – PPO | Admitting: Dermatology

## 2024-01-16 ENCOUNTER — Ambulatory Visit: Payer: 59 | Admitting: Dermatology

## 2024-01-16 ENCOUNTER — Encounter: Payer: Self-pay | Admitting: Dermatology

## 2024-01-16 DIAGNOSIS — Z7189 Other specified counseling: Secondary | ICD-10-CM | POA: Diagnosis not present

## 2024-01-16 DIAGNOSIS — L719 Rosacea, unspecified: Secondary | ICD-10-CM

## 2024-01-16 NOTE — Patient Instructions (Addendum)
 [facility] [address] [place], [place] [address]  Phone: [phone] TOLL-FREE: [phone]     Due to recent changes in healthcare laws, you may see results of your pathology and/or laboratory studies on MyChart before the doctors have had a chance to review them. We understand that in some cases there may be results that are confusing or concerning to you. Please understand that not all results are received at the same time and often the doctors may need to interpret multiple results in order to provide you with the best plan of care or course of treatment. Therefore, we ask that you please give [place] 2 business days to thoroughly review all your results before contacting the office for clarification. Should we see a critical lab result, you will be contacted sooner.   If You Need Anything After Your Visit  If you have any questions or concerns for your doctor, please call our main line at [phone] and press option 4 to reach your doctor's medical assistant. If no one answers, please leave a voicemail as directed and we will return your call as soon as possible. Messages left after 4 pm will be answered the following business day.   You may also send [place] a message via MyChart. We typically respond to MyChart messages within 1-2 business days.  For prescription refills, please ask your pharmacy to contact our office. Our fax number is [phone].  If you have an urgent issue when the clinic is closed that cannot wait until the next business day, you can page your doctor at the number below.    Please note that while we do our best to be available for urgent issues outside of office hours, we are not available 24/7.   If you have an urgent issue and are unable to reach [place], you may choose to seek medical care at your doctor's office, retail clinic, urgent care center, or emergency room.  If you have a medical emergency, please immediately call 911 or go to the emergency  department.  Pager Numbers  - Dr. Gwen Pounds: [phone]  - Dr. Roseanne Reno: [phone]  - Dr. Katrinka Blazing: [phone]   In the event of inclement weather, please call our main line at [phone] for an update on the status of any delays or closures.  Dermatology Medication Tips: Please keep the boxes that topical medications come in in order to help keep track of the instructions about where and how to use these. Pharmacies typically print the medication instructions only on the boxes and not directly on the medication tubes.   If your medication is too expensive, please contact our office at [phone] option 4 or send [place] a message through MyChart.   We are unable to tell what your co-pay for medications will be in advance as this is different depending on your insurance coverage. However, we may be able to find a substitute medication at lower cost or fill out paperwork to get insurance to cover a needed medication.   If a prior authorization is required to get your medication covered by your insurance company, please allow [place] 1-2 business days to complete this process.  Drug prices often vary depending on where the prescription is filled and some pharmacies may offer cheaper prices.  The website www.goodrx.com contains coupons for medications through different pharmacies. The prices here do not account for what the cost may be with help from insurance (it may be cheaper with your insurance), but the website can give you the price if  you did not use any insurance.  - You can print the associated coupon and take it with your prescription to the pharmacy.  - You may also stop by our office during regular business hours and pick up a GoodRx coupon card.  - If you need your prescription sent electronically to a different pharmacy, notify our office through [facility] or by phone at [phone] option 4.     Si Usted Necesita Algo Despus de Su Visita  Tambin puede enviarnos  un mensaje a travs de Clinical cytogeneticist. Por lo general respondemos a los mensajes de MyChart en el transcurso de 1 a 2 das hbiles.  Para renovar recetas, por favor pida a su farmacia que se ponga en contacto con nuestra oficina. Annie Sable de fax es [place] [phone].  Si tiene un asunto urgente cuando la clnica est cerrada y que no puede esperar hasta el siguiente da hbil, puede llamar/localizar a su doctor(a) al nmero que aparece a continuacin.   Por favor, tenga en cuenta que aunque hacemos todo lo posible para estar disponibles para asuntos urgentes fuera del horario de [place], no estamos disponibles las 24 horas del da, los 7 [address]  [address] de la [place].   Si tiene un problema urgente y no puede comunicarse con nosotros, puede optar por buscar atencin mdica  en el consultorio de su doctor(a), en una clnica privada, en un centro de atencin urgente o en una sala de emergencias.  Si tiene Engineer, drilling, por favor llame inmediatamente al 911 o vaya a la sala de emergencias.  Nmeros de bper  - Dr. Gwen Pounds: [phone]  - Dra. Roseanne Reno: [phone]  - Dr. Katrinka Blazing: [phone]   En caso de inclemencias del tiempo, por favor llame a Lacy Duverney principal al [phone] para una actualizacin sobre el [place] [place] de cualquier retraso o cierre.  Consejos para la medicacin en dermatologa: Por favor, guarde las cajas en las que vienen los medicamentos de uso tpico para ayudarle a seguir las instrucciones sobre dnde y cmo usarlos. Las farmacias generalmente imprimen las instrucciones del medicamento slo en las cajas y no directamente en los tubos del [place].   Si su medicamento es muy caro, por favor, pngase en contacto con Rolm Gala llamando al [phone] y presione la opcin 4 o envenos un mensaje a travs de Clinical cytogeneticist.   No podemos decirle cul ser su copago por los medicamentos por adelantado ya que esto es diferente dependiendo de la cobertura de su seguro. Sin  embargo, es posible que podamos encontrar un medicamento sustituto a Audiological scientist un formulario para que el seguro cubra el medicamento que se considera necesario.   Si se requiere una autorizacin previa para que su compaa de seguros [place] su medicamento, por favor permtanos de 1 a 2 das hbiles para completar [address]  Los precios de los medicamentos varan con frecuencia dependiendo del Environmental consultant de dnde se surte la receta y alguna farmacias pueden ofrecer precios ms baratos.  El sitio web www.goodrx.com tiene cupones para medicamentos de Health and safety inspector. Los precios aqu no tienen en cuenta lo que podra costar con la ayuda del seguro (puede ser ms barato con su seguro), pero el sitio web puede darle el precio si no utiliz Tourist information centre manager.  - Puede imprimir el cupn correspondiente y llevarlo con su receta a la farmacia.  - Tambin puede pasar por nuestra oficina durante el horario de atencin regular y Education officer, museum una tarjeta de cupones de GoodRx.  - Si necesita que su  receta se enve electrnicamente a una farmacia diferente, informe a nuestra oficina a travs de MyChart de [place] o por telfono llamando al [phone] y presione la opcin 4.

## 2024-01-16 NOTE — Progress Notes (Signed)
   Follow-Up Visit   Subjective  Kara Kirk is a 63 y.o. female who presents for the following: rosacea follow up, pt took doxycyline 20mg  for about 4 days and then discontinued due to upsetting stomach even while taking with food, pt has been using AZELAIC 15%/ IVERMECTIN 1%/ METRONIDA 1% (TRIPLE ROSACEA) CREAM and has helped a lot and done very well with just this.   The following portions of the chart were reviewed this encounter and updated as appropriate: medications, allergies, medical history  Review of Systems:  No other skin or systemic complaints except as noted in HPI or Assessment and Plan.  Objective  Well appearing patient in no apparent distress; mood and affect are within normal limits.   A focused examination was performed of the following areas: Face  Exam of face limited by presence of make-up.   Relevant exam findings are noted in the Assessment and Plan.    Assessment & Plan   ROSACEA, erythematotelangiectatic and papulopustular Exam: central facial telangectasia with clearance of pink inflamed papules  Exam of face limited by presence of make-up, wiped some off with alcohol  Chronic and persistent condition with duration or expected duration over one year. Condition is bothersome/symptomatic for patient. Well controlled   Rosacea is a chronic progressive skin condition usually affecting the face of adults, causing redness and/or acne bumps. It is treatable but not curable. It sometimes affects the eyes (ocular rosacea) as well. It may respond to topical and/or systemic medication and can flare with stress, sun exposure, alcohol, exercise, topical steroids (including hydrocortisone/cortisone 10) and some foods.  Daily application of broad spectrum spf 30+ sunscreen to face is recommended to reduce flares.  Treatment Plan:  Continue with [place] [facility]10 AZELAIC 15%/ IVERMECTIN 1%/ METRONIDA 1% (TRIPLE ROSACEA) CREAM 30 grams BID. Patient will call  pharmacy for refills when needed.  Counseling for BBL / IPL / Laser and Coordination of Care Discussed the treatment option of Broad Band Light (BBL) /Intense Pulsed Light (IPL)/ Laser for skin discoloration, including brown spots and redness.  Typically we recommend at least 1-3 treatment sessions about 5-8 weeks apart for best results.  Cannot have tanned skin when BBL performed, and regular use of sunscreen/photoprotection is advised after the procedure to help maintain results. The patient's condition may also require "maintenance treatments" in the future.  The fee for BBL / laser treatments is $350 per treatment session for the whole face.  A fee can be quoted for other parts of the body.  Insurance typically does not pay for BBL/laser treatments and therefore the fee is an out-of-pocket cost. Recommend prophylactic valtrex treatment. Once scheduled for procedure, will send Rx in prior to patient's appointment.    ROSACEA   Related Medications metroNIDAZOLE (METROCREAM) 0.75 % cream Apply topically 2 (two) times daily.  Return in about 1 year (around 01/15/2025) for w/ Dr. Katrinka Blazing, Rosacea.  Wynonia Lawman, CMA, am acting as scribe for Elie Goody, MD .   Documentation: I have reviewed the above documentation for accuracy and completeness, and I agree with the above.  Elie Goody, MD

## 2024-03-06 ENCOUNTER — Ambulatory Visit: Payer: BC Managed Care – PPO | Admitting: Dermatology

## 2024-04-11 ENCOUNTER — Other Ambulatory Visit: Payer: Self-pay | Admitting: Family Medicine

## 2024-04-11 DIAGNOSIS — R921 Mammographic calcification found on diagnostic imaging of breast: Secondary | ICD-10-CM

## 2024-06-18 ENCOUNTER — Ambulatory Visit
Admission: RE | Admit: 2024-06-18 | Discharge: 2024-06-18 | Disposition: A | Discharge status: Home / Self Care | Source: Ambulatory Visit | Attending: Family Medicine | Admitting: Family Medicine

## 2024-06-18 DIAGNOSIS — R921 Mammographic calcification found on diagnostic imaging of breast: Secondary | ICD-10-CM | POA: Diagnosis present

## 2024-07-03 ENCOUNTER — Other Ambulatory Visit: Payer: Self-pay

## 2024-07-03 ENCOUNTER — Emergency Department

## 2024-07-03 ENCOUNTER — Encounter: Payer: Self-pay | Admitting: Emergency Medicine

## 2024-07-03 ENCOUNTER — Other Ambulatory Visit (HOSPITAL_COMMUNITY): Payer: Self-pay

## 2024-07-03 ENCOUNTER — Encounter
Admission: EM | Disposition: A | Discharge status: Home / Self Care | Payer: Self-pay | Source: Home / Self Care | Attending: Internal Medicine

## 2024-07-03 ENCOUNTER — Inpatient Hospital Stay
Admission: EM | Admit: 2024-07-03 | Discharge: 2024-07-05 | DRG: 280 | Disposition: A | Discharge status: Home / Self Care | Attending: Internal Medicine | Admitting: Internal Medicine

## 2024-07-03 ENCOUNTER — Telehealth (HOSPITAL_COMMUNITY): Payer: Self-pay | Admitting: Pharmacy Technician

## 2024-07-03 DIAGNOSIS — I2542 Coronary artery dissection: Secondary | ICD-10-CM | POA: Diagnosis present

## 2024-07-03 DIAGNOSIS — Z8616 Personal history of COVID-19: Secondary | ICD-10-CM | POA: Diagnosis not present

## 2024-07-03 DIAGNOSIS — I2121 ST elevation (STEMI) myocardial infarction involving left circumflex coronary artery: Principal | ICD-10-CM | POA: Diagnosis present

## 2024-07-03 DIAGNOSIS — Y842 Radiological procedure and radiotherapy as the cause of abnormal reaction of the patient, or of later complication, without mention of misadventure at the time of the procedure: Secondary | ICD-10-CM | POA: Diagnosis present

## 2024-07-03 DIAGNOSIS — E890 Postprocedural hypothyroidism: Secondary | ICD-10-CM | POA: Diagnosis present

## 2024-07-03 DIAGNOSIS — I350 Nonrheumatic aortic (valve) stenosis: Secondary | ICD-10-CM

## 2024-07-03 DIAGNOSIS — Z881 Allergy status to other antibiotic agents status: Secondary | ICD-10-CM

## 2024-07-03 DIAGNOSIS — I213 ST elevation (STEMI) myocardial infarction of unspecified site: Principal | ICD-10-CM | POA: Diagnosis present

## 2024-07-03 DIAGNOSIS — K760 Fatty (change of) liver, not elsewhere classified: Secondary | ICD-10-CM | POA: Diagnosis present

## 2024-07-03 DIAGNOSIS — N183 Chronic kidney disease, stage 3 unspecified: Secondary | ICD-10-CM | POA: Diagnosis present

## 2024-07-03 DIAGNOSIS — Z7902 Long term (current) use of antithrombotics/antiplatelets: Secondary | ICD-10-CM

## 2024-07-03 DIAGNOSIS — I69354 Hemiplegia and hemiparesis following cerebral infarction affecting left non-dominant side: Secondary | ICD-10-CM | POA: Diagnosis not present

## 2024-07-03 DIAGNOSIS — F419 Anxiety disorder, unspecified: Secondary | ICD-10-CM | POA: Diagnosis present

## 2024-07-03 DIAGNOSIS — Z79899 Other long term (current) drug therapy: Secondary | ICD-10-CM

## 2024-07-03 DIAGNOSIS — I352 Nonrheumatic aortic (valve) stenosis with insufficiency: Secondary | ICD-10-CM | POA: Diagnosis present

## 2024-07-03 DIAGNOSIS — I5020 Unspecified systolic (congestive) heart failure: Secondary | ICD-10-CM | POA: Diagnosis present

## 2024-07-03 DIAGNOSIS — F32A Depression, unspecified: Secondary | ICD-10-CM | POA: Diagnosis present

## 2024-07-03 DIAGNOSIS — I130 Hypertensive heart and chronic kidney disease with heart failure and stage 1 through stage 4 chronic kidney disease, or unspecified chronic kidney disease: Secondary | ICD-10-CM | POA: Diagnosis present

## 2024-07-03 DIAGNOSIS — Z9884 Bariatric surgery status: Secondary | ICD-10-CM

## 2024-07-03 DIAGNOSIS — G43909 Migraine, unspecified, not intractable, without status migrainosus: Secondary | ICD-10-CM | POA: Diagnosis present

## 2024-07-03 DIAGNOSIS — D869 Sarcoidosis, unspecified: Secondary | ICD-10-CM | POA: Diagnosis present

## 2024-07-03 DIAGNOSIS — R079 Chest pain, unspecified: Secondary | ICD-10-CM | POA: Diagnosis not present

## 2024-07-03 DIAGNOSIS — I371 Nonrheumatic pulmonary valve insufficiency: Secondary | ICD-10-CM | POA: Diagnosis present

## 2024-07-03 DIAGNOSIS — Z880 Allergy status to penicillin: Secondary | ICD-10-CM

## 2024-07-03 DIAGNOSIS — I5021 Acute systolic (congestive) heart failure: Secondary | ICD-10-CM | POA: Diagnosis not present

## 2024-07-03 DIAGNOSIS — Z862 Personal history of diseases of the blood and blood-forming organs and certain disorders involving the immune mechanism: Secondary | ICD-10-CM

## 2024-07-03 DIAGNOSIS — I6930 Unspecified sequelae of cerebral infarction: Secondary | ICD-10-CM

## 2024-07-03 DIAGNOSIS — I2510 Atherosclerotic heart disease of native coronary artery without angina pectoris: Secondary | ICD-10-CM | POA: Diagnosis present

## 2024-07-03 DIAGNOSIS — I255 Ischemic cardiomyopathy: Secondary | ICD-10-CM | POA: Diagnosis present

## 2024-07-03 DIAGNOSIS — I200 Unstable angina: Secondary | ICD-10-CM | POA: Diagnosis present

## 2024-07-03 DIAGNOSIS — Z7982 Long term (current) use of aspirin: Secondary | ICD-10-CM | POA: Diagnosis not present

## 2024-07-03 DIAGNOSIS — Z79890 Hormone replacement therapy (postmenopausal): Secondary | ICD-10-CM | POA: Diagnosis not present

## 2024-07-03 DIAGNOSIS — Z8669 Personal history of other diseases of the nervous system and sense organs: Secondary | ICD-10-CM

## 2024-07-03 DIAGNOSIS — R5382 Chronic fatigue, unspecified: Secondary | ICD-10-CM | POA: Diagnosis present

## 2024-07-03 DIAGNOSIS — Z90710 Acquired absence of both cervix and uterus: Secondary | ICD-10-CM

## 2024-07-03 HISTORY — PX: CORONARY/GRAFT ACUTE MI REVASCULARIZATION: CATH118305

## 2024-07-03 HISTORY — PX: LEFT HEART CATH AND CORONARY ANGIOGRAPHY: CATH118249

## 2024-07-03 LAB — BASIC METABOLIC PANEL WITH GFR
Anion gap: 14 (ref 5–15)
Anion gap: 9 (ref 5–15)
BUN: 16 mg/dL (ref 8–23)
BUN: 13 mg/dL (ref 8–23)
CO2: 20 mmol/L — ABNORMAL LOW (ref 22–32)
CO2: 26 mmol/L (ref 22–32)
Calcium: 9.6 mg/dL (ref 8.9–10.3)
Calcium: 9.1 mg/dL (ref 8.9–10.3)
Chloride: 104 mmol/L (ref 98–111)
Chloride: 104 mmol/L (ref 98–111)
Creatinine, Ser: 1 mg/dL (ref 0.44–1.00)
Creatinine, Ser: 0.76 mg/dL (ref 0.44–1.00)
GFR, Estimated: >60 mL/min (ref >60)
GFR, Estimated: >60 mL/min (ref >60)
Glucose, Bld: 131 mg/dL — ABNORMAL HIGH (ref 70–99)
Glucose, Bld: 122 mg/dL — ABNORMAL HIGH (ref 70–99)
Potassium: 3.5 mmol/L (ref 3.5–5.1)
Potassium: 4.5 mmol/L (ref 3.5–5.1)
Sodium: 138 mmol/L (ref 135–145)
Sodium: 139 mmol/L (ref 135–145)

## 2024-07-03 LAB — CBC
HCT: 44.2 % (ref 36.0–46.0)
HCT: 42.2 % (ref 36.0–46.0)
Hemoglobin: 14.9 g/dL (ref 12.0–15.0)
Hemoglobin: 13.5 g/dL (ref 12.0–15.0)
MCH: 30.5 pg (ref 26.0–34.0)
MCH: 30.1 pg (ref 26.0–34.0)
MCHC: 33.7 g/dL (ref 30.0–36.0)
MCHC: 32 g/dL (ref 30.0–36.0)
MCV: 90.6 fL (ref 80.0–100.0)
MCV: 94.2 fL (ref 80.0–100.0)
Platelets: 261 K/uL (ref 150–400)
Platelets: 205 K/uL (ref 150–400)
RBC: 4.88 MIL/uL (ref 3.87–5.11)
RBC: 4.48 MIL/uL (ref 3.87–5.11)
RDW: 13.5 % (ref 11.5–15.5)
RDW: 13.5 % (ref 11.5–15.5)
WBC: 9.8 K/uL (ref 4.0–10.5)
WBC: 8.1 K/uL (ref 4.0–10.5)
nRBC: 0 % (ref 0.0–0.2)
nRBC: 0 % (ref 0.0–0.2)

## 2024-07-03 LAB — PROTIME-INR
INR: 0.9 (ref 0.8–1.2)
Prothrombin Time: 12.7 s (ref 11.4–15.2)

## 2024-07-03 LAB — HEMOGLOBIN A1C
Hgb A1c MFr Bld: 5.1 % (ref 4.8–5.6)
Mean Plasma Glucose: 100 mg/dL

## 2024-07-03 LAB — LIPID PANEL
Cholesterol: 139 mg/dL (ref 0–200)
HDL: 77 mg/dL (ref >40)
LDL Cholesterol: 58 mg/dL (ref 0–99)
Total CHOL/HDL Ratio: 1.8 ratio
Triglycerides: 18 mg/dL (ref <150)
VLDL: 4 mg/dL (ref 0–40)

## 2024-07-03 LAB — APTT: aPTT: 25 s (ref 24–36)

## 2024-07-03 LAB — HEPATIC FUNCTION PANEL
ALT: 27 U/L (ref 0–44)
AST: 34 U/L (ref 15–41)
Albumin: 4.3 g/dL (ref 3.5–5.0)
Alkaline Phosphatase: 64 U/L (ref 38–126)
Bilirubin, Direct: <0.1 mg/dL (ref 0.0–0.2)
Total Bilirubin: 0.6 mg/dL (ref 0.0–1.2)
Total Protein: 7.1 g/dL (ref 6.5–8.1)

## 2024-07-03 LAB — LIPASE, BLOOD: Lipase: 72 U/L — ABNORMAL HIGH (ref 11–51)

## 2024-07-03 LAB — TSH: TSH: 4.721 u[IU]/mL — ABNORMAL HIGH (ref 0.350–4.500)

## 2024-07-03 LAB — TROPONIN I (HIGH SENSITIVITY)
Troponin I (High Sensitivity): 33 ng/L — ABNORMAL HIGH (ref <18)
Troponin I (High Sensitivity): 12528 ng/L (ref <18)

## 2024-07-03 LAB — MRSA NEXT GEN BY PCR, NASAL: MRSA by PCR Next Gen: NOT DETECTED

## 2024-07-03 LAB — GLUCOSE, CAPILLARY: Glucose-Capillary: 123 mg/dL — ABNORMAL HIGH (ref 70–99)

## 2024-07-03 LAB — HIV ANTIBODY (ROUTINE TESTING W REFLEX): HIV Screen 4th Generation wRfx: NONREACTIVE

## 2024-07-03 SURGERY — CORONARY/GRAFT ACUTE MI REVASCULARIZATION
Anesthesia: Moderate Sedation

## 2024-07-03 MED ORDER — METOPROLOL TARTRATE 25 MG PO TABS
12.5000 mg | ORAL_TABLET | Freq: Two times a day (BID) | ORAL | Status: DC
  Administered 2024-07-03 – 2024-07-05 (×7): 12.5 mg via ORAL
  Filled 2024-07-03 (×5): qty 1

## 2024-07-03 MED ORDER — ASPIRIN 81 MG PO CHEW
324.0000 mg | CHEWABLE_TABLET | Freq: Once | ORAL | Status: AC
  Administered 2024-07-03 (×2): 324 mg via ORAL
  Filled 2024-07-03: qty 4

## 2024-07-03 MED ORDER — SODIUM CHLORIDE 0.9 % IV SOLN: INTRAVENOUS | Status: DC

## 2024-07-03 MED ORDER — LIDOCAINE HCL 1 % IJ SOLN
INTRAMUSCULAR | Status: AC
  Filled 2024-07-03: qty 20

## 2024-07-03 MED ORDER — HYDRALAZINE HCL 20 MG/ML IJ SOLN: 10.0000 mg | INTRAMUSCULAR | Status: DC | PRN

## 2024-07-03 MED ORDER — LABETALOL HCL 5 MG/ML IV SOLN: 10.0000 mg | INTRAVENOUS | Status: DC | PRN

## 2024-07-03 MED ORDER — LACTATED RINGERS IV BOLUS
1000.0000 mL | Freq: Once | INTRAVENOUS | Status: AC
  Administered 2024-07-03 (×2): 1000 mL via INTRAVENOUS

## 2024-07-03 MED ORDER — LEVOTHYROXINE SODIUM 50 MCG PO TABS
175.0000 ug | ORAL_TABLET | Freq: Every day | ORAL | Status: DC
  Administered 2024-07-03 (×2): 175 ug via ORAL
  Filled 2024-07-03: qty 2

## 2024-07-03 MED ORDER — MORPHINE SULFATE (PF) 2 MG/ML IV SOLN
2.0000 mg | INTRAVENOUS | Status: DC | PRN
  Administered 2024-07-03 (×6): 2 mg via INTRAVENOUS
  Filled 2024-07-03 (×3): qty 1

## 2024-07-03 MED ORDER — ACETAMINOPHEN 650 MG RE SUPP: 650.0000 mg | Freq: Four times a day (QID) | RECTAL | Status: DC | PRN

## 2024-07-03 MED ORDER — NITROGLYCERIN 0.4 MG SL SUBL
0.4000 mg | SUBLINGUAL_TABLET | SUBLINGUAL | Status: DC | PRN
  Administered 2024-07-03 (×8): 0.4 mg via SUBLINGUAL
  Filled 2024-07-03: qty 1

## 2024-07-03 MED ORDER — HEPARIN BOLUS VIA INFUSION
3600.0000 [IU] | Freq: Once | INTRAVENOUS | Status: DC
  Filled 2024-07-03: qty 3600

## 2024-07-03 MED ORDER — VERAPAMIL HCL 2.5 MG/ML IV SOLN
INTRAVENOUS | Status: AC
  Filled 2024-07-03: qty 2

## 2024-07-03 MED ORDER — VERAPAMIL HCL 2.5 MG/ML IV SOLN
INTRAVENOUS | Status: DC | PRN
Start: 2024-07-03 — End: 2024-07-03
  Administered 2024-07-03 (×4): 2.5 mg via INTRAVENOUS

## 2024-07-03 MED ORDER — ACETAMINOPHEN 325 MG PO TABS
650.0000 mg | ORAL_TABLET | ORAL | Status: DC | PRN
Start: 2024-07-03 — End: 2024-07-03

## 2024-07-03 MED ORDER — MIDAZOLAM HCL 2 MG/2ML IJ SOLN
INTRAMUSCULAR | Status: AC
  Filled 2024-07-03: qty 2

## 2024-07-03 MED ORDER — ONDANSETRON HCL 4 MG/2ML IJ SOLN
4.0000 mg | Freq: Four times a day (QID) | INTRAMUSCULAR | Status: DC | PRN
  Administered 2024-07-03 (×2): 4 mg via INTRAVENOUS
  Filled 2024-07-03: qty 2

## 2024-07-03 MED ORDER — FENTANYL CITRATE PF 50 MCG/ML IJ SOSY
50.0000 ug | PREFILLED_SYRINGE | Freq: Once | INTRAMUSCULAR | Status: AC
  Administered 2024-07-03 (×2): 50 ug via INTRAVENOUS
  Filled 2024-07-03: qty 1

## 2024-07-03 MED ORDER — METOPROLOL TARTRATE 25 MG PO TABS: 12.5000 mg | ORAL_TABLET | Freq: Two times a day (BID) | ORAL | Status: DC

## 2024-07-03 MED ORDER — FUROSEMIDE 10 MG/ML IJ SOLN: 20.0000 mg | Freq: Once | INTRAMUSCULAR | Status: DC

## 2024-07-03 MED ORDER — ACETAMINOPHEN 325 MG PO TABS
650.0000 mg | ORAL_TABLET | Freq: Four times a day (QID) | ORAL | Status: DC | PRN
  Administered 2024-07-04: 650 mg via ORAL
  Filled 2024-07-03 (×2): qty 2

## 2024-07-03 MED ORDER — ASPIRIN 81 MG PO CHEW
81.0000 mg | CHEWABLE_TABLET | Freq: Every day | ORAL | Status: DC
  Administered 2024-07-03 – 2024-07-05 (×4): 81 mg via ORAL
  Filled 2024-07-03 (×3): qty 1

## 2024-07-03 MED ORDER — THYROID 60 MG PO TABS
90.0000 mg | ORAL_TABLET | Freq: Every day | ORAL | Status: DC
  Administered 2024-07-03 – 2024-07-05 (×4): 90 mg via ORAL
  Filled 2024-07-03 (×3): qty 1

## 2024-07-03 MED ORDER — HEPARIN (PORCINE) IN NACL 1000-0.9 UT/500ML-% IV SOLN
INTRAVENOUS | Status: AC
  Filled 2024-07-03: qty 1000

## 2024-07-03 MED ORDER — HYDROMORPHONE HCL 1 MG/ML IJ SOLN
1.0000 mg | Freq: Once | INTRAMUSCULAR | Status: AC
  Administered 2024-07-03 (×2): 1 mg via INTRAVENOUS
  Filled 2024-07-03: qty 1

## 2024-07-03 MED ORDER — ORAL CARE MOUTH RINSE
15.0000 mL | OROMUCOSAL | Status: DC | PRN
Start: 2024-07-03 — End: 2024-07-05

## 2024-07-03 MED ORDER — SODIUM CHLORIDE 0.9 % IV SOLN: 250.0000 mL | INTRAVENOUS | Status: DC | PRN

## 2024-07-03 MED ORDER — CHLORHEXIDINE GLUCONATE CLOTH 2 % EX PADS
6.0000 | MEDICATED_PAD | Freq: Every day | CUTANEOUS | Status: DC
  Administered 2024-07-03 – 2024-07-05 (×4): 6 via TOPICAL

## 2024-07-03 MED ORDER — OXYCODONE HCL 5 MG PO TABS
5.0000 mg | ORAL_TABLET | Freq: Four times a day (QID) | ORAL | Status: DC | PRN
  Administered 2024-07-03 (×2): 5 mg via ORAL
  Filled 2024-07-03: qty 1

## 2024-07-03 MED ORDER — ENOXAPARIN SODIUM 40 MG/0.4ML IJ SOSY: 40.0000 mg | PREFILLED_SYRINGE | INTRAMUSCULAR | Status: DC

## 2024-07-03 MED ORDER — FENTANYL CITRATE (PF) 100 MCG/2ML IJ SOLN
INTRAMUSCULAR | Status: AC
  Filled 2024-07-03: qty 2

## 2024-07-03 MED ORDER — HEPARIN SODIUM (PORCINE) 1000 UNIT/ML IJ SOLN
INTRAMUSCULAR | Status: AC
  Filled 2024-07-03: qty 10

## 2024-07-03 MED ORDER — HEPARIN (PORCINE) IN NACL 1000-0.9 UT/500ML-% IV SOLN
INTRAVENOUS | Status: DC | PRN
  Administered 2024-07-03 (×2): 1000 mL

## 2024-07-03 MED ORDER — BUPROPION HCL ER (XL) 150 MG PO TB24
150.0000 mg | ORAL_TABLET | Freq: Two times a day (BID) | ORAL | Status: DC
  Administered 2024-07-03 – 2024-07-05 (×7): 150 mg via ORAL
  Filled 2024-07-03 (×5): qty 1

## 2024-07-03 MED ORDER — SODIUM CHLORIDE 0.9% FLUSH
3.0000 mL | Freq: Two times a day (BID) | INTRAVENOUS | Status: DC
  Administered 2024-07-03 – 2024-07-05 (×7): 3 mL via INTRAVENOUS

## 2024-07-03 MED ORDER — HEPARIN (PORCINE) 25000 UT/250ML-% IV SOLN
750.0000 [IU]/h | INTRAVENOUS | Status: DC
  Administered 2024-07-03 (×2): 750 [IU]/h via INTRAVENOUS
  Filled 2024-07-03: qty 250

## 2024-07-03 MED ORDER — HEPARIN SODIUM (PORCINE) 5000 UNIT/ML IJ SOLN
60.0000 [IU]/kg | Freq: Once | INTRAMUSCULAR | Status: AC
  Administered 2024-07-03 (×2): 3650 [IU] via INTRAVENOUS
  Filled 2024-07-03: qty 1

## 2024-07-03 MED ORDER — LIDOCAINE HCL (PF) 1 % IJ SOLN
INTRAMUSCULAR | Status: DC | PRN
  Administered 2024-07-03 (×2): 5 mL

## 2024-07-03 MED ORDER — SODIUM CHLORIDE 0.9% FLUSH: 3.0000 mL | INTRAVENOUS | Status: DC | PRN

## 2024-07-03 MED ORDER — IOHEXOL 300 MG/ML  SOLN
INTRAMUSCULAR | Status: DC | PRN
  Administered 2024-07-03 (×2): 60 mL

## 2024-07-03 SURGICAL SUPPLY — 14 items
CATH INFINITI 5 FR JL3.5 (CATHETERS) IMPLANT
CATH INFINITI AMBI 5FR TG (CATHETERS) IMPLANT
CATH INFINITI JR4 5F (CATHETERS) IMPLANT
DEVICE RAD TR BAND REGULAR (VASCULAR PRODUCTS) IMPLANT
DRAPE BRACHIAL (DRAPES) IMPLANT
GLIDESHEATH SLEND SS 6F .021 (SHEATH) IMPLANT
GUIDEWIRE INQWIRE 1.5J.035X260 (WIRE) IMPLANT
KIT ENCORE 26 ADVANTAGE (KITS) IMPLANT
PACK CARDIAC CATH (CUSTOM PROCEDURE TRAY) ×1 IMPLANT
SET ATX-X65L (MISCELLANEOUS) IMPLANT
STATION PROTECTION PRESSURIZED (MISCELLANEOUS) IMPLANT
TUBING CIL FLEX 10 FLL-RA (TUBING) IMPLANT
WIRE EMERALD ST .035X150CM (WIRE) IMPLANT
WIRE HITORQ VERSACORE ST 145CM (WIRE) IMPLANT

## 2024-07-03 NOTE — Progress Notes (Signed)
 Pt husband called this nurse to room stating his wife having severe chest pain. Pt appeared worried and concerned about chest pain, which she described as 7/10 severity, sharp, on right side of chest and also behind right shoulder blade, non-radiating. Denied SOB VSS. O2 sat WNL. Dr. Tobie, as well as Cardiology PA's notified. 12 lead EKG was performed and transmitted to electronic record. NSR noted. Morphine  2 mg given with resolution of pain down to 1/10 severity. Pt sleeping at this time.

## 2024-07-03 NOTE — ED Provider Notes (Signed)
 [facility] Provider Note    Event Date/Time   First MD Initiated Contact with Patient 07/03/24 [phone]     (approximate)   History   Chest Pain   HPI  Kara Kirk is a 63 y.o. female who presents to the ED for evaluation of Chest Pain   I reviewed PCP visit from April.  History of stroke on Plavix, anxiety and depression.  History of gastric bypass, sarcoid. Review and initial [facility] cardiology outpatient consult from October.  Cardiac CT with calcium score of 0.  Murmur of aortic insufficiency, I do not see any echocardiogram.  Patient presents to the ED with acute chest pain for the past 1-2 hours.  Presents with her husband.  They report that she did not feel right before going to bed but no focal symptoms.  She woke from sleep around 1 AM with severe pain, woke up her husband and he brings her to the ED.  On arrival she has 10/10 crushing chest pain.  Mild bifrontal headache.  Sensation of dyspnea.   Physical Exam   Triage Vital Signs: ED Triage Vitals  Encounter Vitals Group     BP 07/03/24 0221 (!) 169/73     Girls Systolic BP Percentile --      Girls Diastolic BP Percentile --      Boys Systolic BP Percentile --      Boys Diastolic BP Percentile --      Pulse Rate 07/03/24 0221 76     Resp 07/03/24 0221 17     Temp 07/03/24 0221 97.8 F (36.6 C)     Temp Source 07/03/24 0221 Oral     SpO2 07/03/24 0221 100 %     Weight 07/03/24 0224 135 lb (61.2 kg)     Height 07/03/24 0224 5' 1 (1.549 m)     Head Circumference --      Peak Flow --      Pain Score 07/03/24 0224 9     Pain Loc --      Pain Education --      Exclude from Growth Chart --     Most recent vital signs: Vitals:   07/03/24 0221 07/03/24 0315  BP: (!) 169/73 (!) 155/80  Pulse: 76 73  Resp: 17 20  Temp: 97.8 F (36.6 C)   SpO2: 100% 96%    General: Awake.  Clearly uncomfortable, clutching her chest and looks bad CV:  Good peripheral perfusion.   Holosystolic murmur.  Symmetric and 2+ bilateral radial and DP pulses Resp:  Normal effort.  Abd:  No distention.  MSK:  No deformity noted.  Neuro:  No focal deficits appreciated. Other:     ED Results / Procedures / Treatments   Labs (all labs ordered are listed, but only abnormal results are displayed) Labs Reviewed  BASIC METABOLIC PANEL WITH GFR - Abnormal; Notable for the following components:      Result Value   CO2 20 (*)    Glucose, Bld 131 (*)    All other components within normal limits  LIPASE, BLOOD - Abnormal; Notable for the following components:   Lipase 72 (*)    All other components within normal limits  TROPONIN I (HIGH SENSITIVITY) - Abnormal; Notable for the following components:   Troponin I (High Sensitivity) 33 (*)    All other components within normal limits  CBC  PROTIME-INR  APTT  HEPATIC FUNCTION PANEL    EKG Sinus rhythm with a rate  of 80 bpm.  Normal axis and intervals.  Nonspecific ST changes without clear STEMI.  Subtle J-point depressions to V2, elevations to inferior leads.  Does not meet STEMI criteria.  No comparison EKG.  Multiple repeat EKGs, all demonstrating sinus rhythms, rates in the 70s with normal axis and intervals.  Evolving ST changes with worsening ST elevations to V4-V6, inferior leads with ST depressions to aVL and V2  RADIOLOGY CXR interpreted by me without evidence of acute cardiopulmonary pathology.  Official radiology report(s): DG Chest Port 1 View Result Date: 07/03/2024 CLINICAL DATA:  Chest pain and shortness of breath EXAM: PORTABLE CHEST 1 VIEW COMPARISON:  05/06/2011 FINDINGS: The heart size and mediastinal contours are within normal limits. Both lungs are clear. The visualized skeletal structures are unremarkable. IMPRESSION: No active disease. Electronically Signed   By: Oneil Devonshire M.D.   On: 07/03/2024 03:03    PROCEDURES and INTERVENTIONS:  .1-3 Lead EKG Interpretation  Performed by: Claudene Rover,  MD Authorized by: Claudene Rover, MD     Interpretation: normal     ECG rate:  74   ECG rate assessment: normal     Rhythm: sinus rhythm     Ectopy: none     Conduction: normal   .Critical Care  Performed by: Claudene Rover, MD Authorized by: Claudene Rover, MD   Critical care provider statement:    Critical care time (minutes):  30   Critical care time was exclusive of:  Separately billable procedures and treating other patients   Critical care was necessary to treat or prevent imminent or life-threatening deterioration of the following conditions:  Circulatory failure and cardiac failure   Critical care was time spent personally by me on the following activities:  Development of treatment plan with patient or surrogate, discussions with consultants, evaluation of patient's response to treatment, examination of patient, ordering and review of laboratory studies, ordering and review of radiographic studies, ordering and performing treatments and interventions, pulse oximetry, re-evaluation of patient's condition and review of old charts   Medications  nitroGLYCERIN  (NITROSTAT ) SL tablet 0.4 mg (0.4 mg Sublingual Given 07/03/24 0327)  heparin  ADULT infusion 100 units/mL (25000 units/250mL) (750 Units/hr Intravenous New Bag/Given 07/03/24 0332)  aspirin  chewable tablet 324 mg (324 mg Oral Given 07/03/24 0248)  fentaNYL  (SUBLIMAZE ) injection 50 mcg (50 mcg Intravenous Given 07/03/24 0250)  fentaNYL  (SUBLIMAZE ) injection 50 mcg (50 mcg Intravenous Given 07/03/24 0306)  lactated ringers  bolus 1,000 mL (1,000 mLs Intravenous New Bag/Given 07/03/24 0310)  heparin  injection 3,650 Units (3,650 Units Intravenous Given 07/03/24 0320)  HYDROmorphone  (DILAUDID ) injection 1 mg (1 mg Intravenous Given 07/03/24 0326)     IMPRESSION / MDM / ASSESSMENT AND PLAN / ED COURSE  I reviewed the triage vital signs and the nursing notes.  Differential diagnosis includes, but is not limited to, ACS, PTX, PNA, muscle  strain/spasm, PE, dissection, anxiety, pleural effusion  {Patient presents with symptoms of an acute illness or injury that is potentially life-threatening.  Patient presents with acute crushing chest pain and signs of an evolving STEMI requiring an emergent left heart cath.  Doubt dissection considering symmetric pulses without neurologic deficits.  X-rays reassuring.  First troponin is mildly elevated without comparison or reason for chronic elevation.  Normal CBC.  Clinical Course as of 07/03/24 0336  Wed Jul 03, 2024  0256 Page Dr. Mady [DS]  [phone] I consult Dr. Mady and discussed very concerning presentation and clinical picture.  Discussed exam findings, EKG with mild ischemic changes but  not clear STEMI.  First troponin mildly elevated.  No comparison for any of these.  Improving pain with nitroglycerin .  He requests a third repeat EKG [DS]  0315 After EKG at 0310.  Still with some discomfort and pain but improvement with nitroglycerin .  Remains hemodynamically stable.  Again discussed with Dr. Mady on the phone, activate code STEMI and they will take her to the Cath Lab [DS]  0317 Updated patient and husband of this.  They are agreeable and appreciative [DS]  0335 Reassessed.  More comfortable after the Dilaudid  [DS]    Clinical Course User Index [DS] Claudene Rover, MD     FINAL CLINICAL IMPRESSION(S) / ED DIAGNOSES   Final diagnoses:  ST elevation myocardial infarction (STEMI), unspecified artery (HCC)  Unstable angina pectoris (HCC)     Rx / DC Orders   ED Discharge Orders     None        Note:  This document was prepared using Dragon voice recognition software and may include unintentional dictation errors.   Claudene Rover, MD 07/03/24 [phone]

## 2024-07-03 NOTE — Consult Note (Signed)
 Cardiology Consultation   Patient ID: Kara Kirk MRN: [id number]; DOB: [birth year]  Admit date: 07/03/2024 Date of Consult: 07/03/2024  PCP:  Alla Amis, MD   [facility] Providers Cardiologist:  Florencio     Patient Profile: Kara Kirk is a 63 y.o. female with a hx of remote stroke on chronic clopidogrel, moderate to severe aortic stenosis and moderate aortic and pulmonic valve regurgitation by echocardiogram in 08/2023, hypertension, migraine headaches, hypothyroidism, sarcoidosis, and morbid obesity s/p bariatric surgery, who is being seen 07/03/2024 for the evaluation of chest pain and abnormal EKG at the request of Dr. Claudene.  History of Present Illness: Kara Kirk reports fairly sudden onset of severe central chest pain rating to the back and both shoulders around midnight.  Yesterday, she had had some vague soreness in her shoulders but thought it was due to lifting objects at work.  Because of the severe chest pain, her husband brought her to the ER, where initial EKG showed less than 1 mm of ST elevation in the inferolateral leads as well as some ST depression in the anterior leads.  She received sublingual nitroglycerin  and fentanyl  with improvement in pain from 9/10 to 6/10.  However, the pain intensified again with repeat EKG now showing 1 mm of ST elevation in V4 through V6, diagnostic for STEMI.  She notes associated shortness of breath and headache.  Patient denies ever having had similar symptoms in the past.  Her husband notes that Kara Kirk has felt more fatigued over the last week or two.  She underwent a coronary calcium score CT in 08/2023 with a CAC of 0.  Aortic valve calcifications noted.  Echo at that time also showed low normal LVEF and reported moderate aortic stenosis (peak velocity 4.2 m/s, mean gradient 45 mmHg, AVA 0.9 cm, DI 0.22), as well as moderate aortic and pulmonic regurgitation.  She was lost to follow-up with Dr.  Florencio.   Past Medical History:  Diagnosis Date   Allergy    Anxiety    Chronic kidney disease    COVID-19 08/10/2020   Loss of taste and smell.  Fatigue.  All resolved by 4 weeks.   CRI (chronic renal insufficiency), stage 3 (moderate) (HCC)    Depression    DJD (degenerative joint disease)    Fatty liver disease, nonalcoholic    GERD (gastroesophageal reflux disease)    Heart murmur    Hypertension    Hypothyroidism    Migraine headache    1-2/month   Obesity    s/p lap band   PONV (postoperative nausea and vomiting)    Sarcoidosis    Stroke (HCC) 1990   Jan/Feb  6 Mini-strokes, then 1 larger stroke that resulted in some left sided numbness   Vertigo    2-3 episodes/year   Wears contact lenses     Past Surgical History:  Procedure Laterality Date   ABDOMINAL HYSTERECTOMY     ABDOMINOPLASTY     COLONOSCOPY     COLONOSCOPY WITH PROPOFOL  N/A 09/17/2021   Procedure: COLONOSCOPY WITH PROPOFOL ;  Surgeon: Maryruth Ole DASEN, MD;  Location: [facility];  Service: Endoscopy;  Laterality: N/A;   ESOPHAGOGASTRODUODENOSCOPY     LAPAROSCOPIC GASTRIC BANDING     Removal 07/2020   LAPAROSCOPIC OOPHERECTOMY     LUNG BIOPSY     lysis fallopian tube adhesion  08/03/2020   NASAL SEPTOPLASTY W/ TURBINOPLASTY Bilateral 11/03/2021   Procedure: NASAL SEPTOPLASTY WITH INFERIOR TURBINATE REDUCTION;  Surgeon: Milissa Hamming, MD;  Location: [facility];  Service: ENT;  Laterality: Bilateral;  PLACED DISK ON OR CHARGE NURSE DESK 10-19 KP   REDUCTION MAMMAPLASTY Bilateral 2016   TUBAL LIGATION       Scheduled Meds:  aspirin   81 mg Oral Daily   buPROPion   150 mg Oral BID   Chlorhexidine  Gluconate Cloth  6 each Topical Daily   metoprolol  tartrate  12.5 mg Oral BID   sodium chloride  flush  3 mL Intravenous Q12H   thyroid   90 mg Oral Q0600   Continuous Infusions:  sodium chloride      sodium chloride      PRN Meds: sodium chloride , acetaminophen , hydrALAZINE ,  labetalol , nitroGLYCERIN , ondansetron  (ZOFRAN ) IV, sodium chloride  flush  Allergies:    Allergies  Allergen Reactions   Penicillin G Other (See Comments)    Other Reaction: labored breathing.   Penicillins Shortness Of Breath   Doxycycline  Other (See Comments)    GI upset    Social History:   Social History   Tobacco Use   Smoking status: Never   Smokeless tobacco: Never  Vaping Use   Vaping status: Never Used  Substance Use Topics   Alcohol use: Yes    Comment: Rare   Drug use: Never    Family History:   Family History  Problem Relation Age of Onset   Breast cancer Neg Hx      ROS:  Please see the history of present illness. All other ROS reviewed and negative.     Physical Exam/Data: Vitals:   07/03/24 0421 07/03/24 0426 07/03/24 0449 07/03/24 0500  BP: (!) 141/64 (!) 153/72 139/81 122/67  Pulse: 76 77 80 71  Resp: 15 18  13   Temp:   97.6 F (36.4 C)   TempSrc:   Oral   SpO2: 99% 100% 100% 100%  Weight:   63.1 kg   Height:   5' 1 (1.549 m)     Intake/Output Summary (Last 24 hours) at 07/03/2024 0524 Last data filed at 07/03/2024 0505 Gross per 24 hour  Intake 1002.26 ml  Output 550 ml  Net 452.26 ml      07/03/2024    4:49 AM 07/03/2024    2:24 AM 11/03/2021    8:48 AM  Last 3 Weights  Weight (lbs) 139 lb 1.8 oz 135 lb 135 lb  Weight (kg) 63.1 kg 61.236 kg 61.236 kg     Body mass index is 26.28 kg/m.  General: Uncomfortable appearing woman lying on stretcher in ED.  Husband and daughter are at the bedside. HEENT: normal Neck: no JVD Vascular: No carotid bruits; Distal pulses 2+ bilaterally Cardiac: Regular rate and rhythm with harsh 3/6 crescendo-decrescendo systolic murmur heard loudest at the left lower sternal border but audible across the precordium Lungs:  clear to auscultation bilaterally, no wheezing, rhonchi or rales  Abd: soft, nontender, no hepatomegaly  Ext: no edema Musculoskeletal:  No deformities, BUE and BLE strength normal and  equal Skin: warm and dry  Neuro:  CNs 2-12 intact, no focal abnormalities noted Psych: Flat affect   EKG:  Serial EKG's personally reviewed.  Most recent tracing at 0310 shows normal sinus rhythm with 1 mm ST elevation in V3-v6.  Previous EKG today show sinus rhythm with nonspecific ST segment changes. Telemetry:  Telemetry was personally reviewed and demonstrates: Normal sinus rhythm  Relevant CV Studies: TTE (09/13/2023, [facility]): LVEF 50% with mild LVH and diastolic dysfunction.  Normal RV size and function.  Thickened tricuspid aortic valve with moderate  stenosis and regurgitation (peak velocity 4.2 m/s, mean gradient 45 mmHg, valve area 0.9 cm, dimensionless index 0.22).  Laboratory Data: High Sensitivity Troponin:   Recent Labs  Lab 07/03/24 0232  TROPONINIHS 33*     Chemistry Recent Labs  Lab 07/03/24 0232  NA 138  K 3.5  CL 104  CO2 20*  GLUCOSE 131*  BUN 16  CREATININE 1.00  CALCIUM 9.6  GFRNONAA >60  ANIONGAP 14    Recent Labs  Lab 07/03/24 0232  PROT 7.1  ALBUMIN 4.3  AST 34  ALT 27  ALKPHOS 64  BILITOT 0.6   Lipids No results for input(s): CHOL, TRIG, HDL, LABVLDL, LDLCALC, CHOLHDL in the last 168 hours.  Hematology Recent Labs  Lab 07/03/24 0232  WBC 9.8  RBC 4.88  HGB 14.9  HCT 44.2  MCV 90.6  MCH 30.5  MCHC 33.7  RDW 13.5  PLT 261   Thyroid  No results for input(s): TSH, FREET4 in the last 168 hours.  BNPNo results for input(s): BNP, PROBNP in the last 168 hours.  DDimer No results for input(s): DDIMER in the last 168 hours.  Radiology/Studies:  CARDIAC CATHETERIZATION Result Date: 07/03/2024 Conclusions: Severe single-vessel coronary artery disease with occlusion of OM3 and irregular narrowing of distal LCx.  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  Otherwise, no angiographically significant CAD. Mildly reduced left ventricular systolic function (LVEF 45-50%) with akinesis of the  mid lateral segment. Borderline elevated left ventricular filling pressure with LVEDP of 15 mmHg, increasing to 30 mmHg post a-wave suggesting underlying diastolic dysfunction. Severe aortic valve stenosis with mean gradient of 58 mmHg. 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  Avoid anticoagulation to minimize risk for further propagation. Check lipid panel (LDL previously well-controlled).  Defer statin for the time being given theoretical risk for increasing potential for SCAD. Obtain echocardiogram. Recommend nonemergent CTA neck, chest, abdomen, and pelvis to evaluate for evidence of fibromuscular dysplasia. Avoid aggressive administration of nitrates and other preload/afterload lowering medications in the setting of severe AS.  Patient will need to be evaluated in multidisciplinary structural heart clinic to determine best timing and approach for management of her aortic valve disease. Lonni Hanson, MD [facility]  DG Chest Port 1 View Result Date: 07/03/2024 CLINICAL DATA:  Chest pain and shortness of breath EXAM: PORTABLE CHEST 1 VIEW COMPARISON:  05/06/2011 FINDINGS: The heart size and mediastinal contours are within normal limits. Both lungs are clear. The visualized skeletal structures are unremarkable. IMPRESSION: No active disease. Electronically Signed   By: Oneil Devonshire M.D.   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  Diagnostic EKG at 0310 confirmed lateral STEMI, prompting emergent catheterization that revealed irregular stenosis and occlusion of distal LCx and OM3 suspicious for spontaneous coronary artery dissection.  Patient's chest pain has improved post catheterization to 1/10; intervention to LCx/OM3 was not pursued given  relatively small vessel size and distal location. - Continue supportive care in ICU. - Obtain echocardiogram. - Transition from clopidogrel to aspirin  81 mg daily. - Avoid anticoagulation given risk of propagation of SCAD/intramural hematoma. - Obtain nonemergent CTA neck, chest, abdomen, and pelvis to screen for fibromuscular dysplasia. - Defer adding a statin given historically well-controlled lipids and theoretical risk of SCAD with statins. - Challenge with metoprolol  to tartrate  12.5 mg twice daily with close monitoring of blood pressure in the setting of severe aortic stenosis.  Heart failure with mildly reduced ejection fraction due to ischemic cardiomyopathy: LVEF appears mildly reduced with mid lateral akinesis likely from OM3/distal LCx scad.  LVEDP borderline elevated at 15 mmHg though significant rise post A wave noted, suggestive of underlying diastolic dysfunction. - Maintain net even fluid balance. - Add metoprolol  to tartrate 12.5 mg twice daily with close monitoring of blood pressure in the setting of severe aortic stenosis.  Severe aortic stenosis: Echo last year interpreted as moderate AS and AI, though gradient values presented in the report are more consistent with severe AS.  Given her relatively young age, I am concerned that she could have a congenitally malformed aortic valve, though it was reported as tricuspid on the outside report.  Repeat echocardiogram has been ordered. - Follow-up echocardiogram. - Minimize preload and afterload reducing agents. - Will need further evaluation with multidisciplinary structural heart team to determine timing and best approach for treatment of aortic valve disease.  As the patient has previously followed with Dr. Florencio, [facility] will assume ongoing cardiology care later this morning.  Risk Assessment/Risk Scores:    TIMI Risk Score for ST  Elevation MI:   The patient's TIMI risk score is 2, which indicates a  2.2% risk of all cause mortality at 30 days.    For questions or updates, please contact [facility] Please consult [email or web address] for contact info under [facility].  Signed, Lonni Hanson, MD  07/03/2024 5:24 AM

## 2024-07-03 NOTE — Progress Notes (Signed)
 PHARMACY - ANTICOAGULATION CONSULT NOTE  Pharmacy Consult for Heparin   Indication: chest pain/ACS  Allergies  Allergen Reactions   Penicillin G Other (See Comments)    Other Reaction: labored breathing.   Penicillins Shortness Of Breath   Doxycycline  Other (See Comments)    GI upset    Patient Measurements: Height: 5' 1 (154.9 cm) Weight: 61.2 kg (135 lb) IBW/kg (Calculated) : 47.8 HEPARIN  DW (KG): 60.2  Vital Signs: Temp: 97.8 F (36.6 C) (08/13 0221) Temp Source: Oral (08/13 0221) BP: 155/80 (08/13 0315) Pulse Rate: 73 (08/13 0315)  Labs: Recent Labs    07/03/24 0232  HGB 14.9  HCT 44.2  PLT 261  APTT 25  LABPROT 12.7  INR 0.9  CREATININE 1.00  TROPONINIHS 33*    Estimated Creatinine Clearance: 48.4 mL/min (by C-G formula based on SCr of 1 mg/dL).   Medical History: Past Medical History:  Diagnosis Date   Allergy    Anxiety    Chronic kidney disease    COVID-19 08/10/2020   Loss of taste and smell.  Fatigue.  All resolved by 4 weeks.   CRI (chronic renal insufficiency), stage 3 (moderate) (HCC)    Depression    DJD (degenerative joint disease)    Fatty liver disease, nonalcoholic    GERD (gastroesophageal reflux disease)    Heart murmur    Hypertension    Hypothyroidism    Migraine headache    1-2/month   Obesity    s/p lap band   PONV (postoperative nausea and vomiting)    Sarcoidosis    Stroke (HCC) 1990   Jan/Feb  6 Mini-strokes, then 1 larger stroke that resulted in some left sided numbness   Vertigo    2-3 episodes/year   Wears contact lenses     Medications:  (Not in a hospital admission)   Assessment: Pharmacy consulted to dose heparin  in this 63 year old female admitted with STEMI.  No prior anticoag noted. CrCl = 48.4 ml/min   Goal of Therapy:  Heparin  level 0.3-0.7 units/ml Monitor platelets by anticoagulation protocol: Yes   Plan:  Give 3600 units bolus x 1 Start heparin  infusion at 750 units/hr Check anti-Xa  level in 6 hours and daily while on heparin  Continue to monitor H&H and platelets  Mashonda Broski D 07/03/2024,3:27 AM

## 2024-07-03 NOTE — ED Notes (Addendum)
 CARDIOLOGIST (END)  IN ROOM @ THIS TIME.

## 2024-07-03 NOTE — Telephone Encounter (Signed)
 Patient Product/process development scientist completed.    The patient is insured through [facility]. Patient has [employer], may use a copay card, and/or apply for patient assistance if available.    Ran test claim for Farxiga  10 mg and the current 30 day co-pay is $30.00.   This test claim was processed through [facility]- copay amounts may vary at other pharmacies due to pharmacy/plan contracts, or as the patient moves through the different stages of their insurance plan.     Reyes Sharps, CPHT Pharmacy Technician III Certified Patient Advocate [facility] Patient Advocate Team Direct Number: [phone]  Fax: [phone]

## 2024-07-03 NOTE — Assessment & Plan Note (Signed)
 No acute issues suspected

## 2024-07-03 NOTE — ED Triage Notes (Addendum)
 Pt to triage via w/c, appears uncomfortable, clutching chest, moaning, reports upper CP radiating into back accomp by [facility] and HA that began last night; denies hx of same

## 2024-07-03 NOTE — ED Notes (Signed)
 Stemi called to [facility] spoke with Tammie

## 2024-07-03 NOTE — Assessment & Plan Note (Signed)
 Continue levothyroxine 

## 2024-07-03 NOTE — Progress Notes (Signed)
 [facility] CLINIC CARDIOLOGY PROGRESS NOTE       Patient ID: LEDORA Kirk MRN: [id number] DOB/AGE: 1961/08/08 63 y.o.  Admit date: 07/03/2024 Referring Physician Dr. Mady Primary Physician Alla Amis, MD Primary Cardiologist Dr. Florencio (2024) Reason for Consultation STEMI   HPI: Kara Kirk is a 63 y.o. female  with a past medical hypertension, migraine headaches, sarcoidosis, history of hx remote stroke (chronic Plavix), moderate to severe aortic stenosis, moderate AR, Moderate PR, morbid obesity s/p bariatric surgery, hypothyroidism who presented to the ED on 07/03/2024 for sudden onset severe central chest pain that radiated between shoulder blades. EKG in ED revealed less than 1 mm of ST elevation in the inferolateral leads as well as some ST depression in the anterior leads. Chest pain got worse, repeat EKG revealed  1 mm of ST elevation in V4 through V6, diagnostic for STEMI. Patient underwent emergent LHC with Dr. Mady that suggested suspicion for SCAD, otherwise no significant CAD noted. Also severe AS with mean gradient of 58 mmHg. Patient see's Dr. Florencio outpatient. [facility] resume care.   Interval History: -Patient seen and examined this AM and laying comfortably in hospital bed. Patient states she feels tired, didn't sleep well. States chest pressure/tightness and between shoulder blade tightness has improved...rates 1/10. Denies SOB or lightheadedness.  -Patients BP and HR stable this AM. Overnight Tele showed no significant events.  -Patient remains on room air with stable SpO2.   Pertinent Cardiac History (Most recent) LHC (07/03/2024 with Dr. Mady) Severe single-vessel coronary artery disease with occlusion of OM3 and irregular narrowing of distal LCx.  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  Otherwise, no angiographically significant CAD. Mildly reduced left ventricular systolic function (LVEF 45-50%) with akinesis of the mid  lateral segment. Borderline elevated left ventricular filling pressure with LVEDP of 15 mmHg, increasing to 30 mmHg post a-wave suggesting underlying diastolic dysfunction. Severe aortic valve stenosis with mean gradient of 58 mmHg.  Echo (08/2023) MILD LEFT VENTRICULAR SYSTOLIC DYSFUNCTION WITH MILD LVH  ESTIMATED EF: 50%, CALC EF(2D): 50%  NORMAL LA PRESSURES WITH DIASTOLIC DYSFUNCTION  NORMAL RIGHT VENTRICULAR SYSTOLIC FUNCTION  VALVULAR REGURGITATION: MODERATE AR, TRIVIAL MR, MODERATE PR, TRIVIAL TR  VALVULAR STENOSIS: MODERATE AS, No MS, No PS, No TS   AORTIC STENOSIS GRADIENTS INCREASED SINCE PREVIOUS ECHO  AV Peak: 4.43m/s; AVA: 0.9cm^2 ; DI: 0.22  (Prev. exam AoV PV: 2.21m/s: no AVA or DI recorded)   Review of systems complete and found to be negative unless listed above    Past Medical History:  Diagnosis Date   Allergy    Anxiety    Chronic kidney disease    COVID-19 08/10/2020   Loss of taste and smell.  Fatigue.  All resolved by 4 weeks.   CRI (chronic renal insufficiency), stage 3 (moderate) (HCC)    Depression    DJD (degenerative joint disease)    Fatty liver disease, nonalcoholic    GERD (gastroesophageal reflux disease)    Heart murmur    Hypertension    Hypothyroidism    Migraine headache    1-2/month   Obesity    s/p lap band   PONV (postoperative nausea and vomiting)    Sarcoidosis    Stroke (HCC) 1990   Jan/Feb  6 Mini-strokes, then 1 larger stroke that resulted in some left sided numbness   Vertigo    2-3 episodes/year   Wears contact lenses     Past Surgical History:  Procedure Laterality Date  ABDOMINAL HYSTERECTOMY     ABDOMINOPLASTY     COLONOSCOPY     COLONOSCOPY WITH PROPOFOL  N/A 09/17/2021   Procedure: COLONOSCOPY WITH PROPOFOL ;  Surgeon: Maryruth Ole DASEN, MD;  Location: [facility] ENDOSCOPY;  Service: Endoscopy;  Laterality: N/A;   CORONARY/GRAFT ACUTE MI REVASCULARIZATION N/A 07/03/2024   Procedure: Coronary/Graft Acute MI  Revascularization;  Surgeon: Mady Bruckner, MD;  Location: [facility] INVASIVE CV LAB;  Service: Cardiovascular;  Laterality: N/A;   ESOPHAGOGASTRODUODENOSCOPY     LAPAROSCOPIC GASTRIC BANDING     Removal 07/2020   LAPAROSCOPIC OOPHERECTOMY     LEFT HEART CATH AND CORONARY ANGIOGRAPHY N/A 07/03/2024   Procedure: LEFT HEART CATH AND CORONARY ANGIOGRAPHY;  Surgeon: Mady Bruckner, MD;  Location: [facility] INVASIVE CV LAB;  Service: Cardiovascular;  Laterality: N/A;   LUNG BIOPSY     lysis fallopian tube adhesion  08/03/2020   NASAL SEPTOPLASTY W/ TURBINOPLASTY Bilateral 11/03/2021   Procedure: NASAL SEPTOPLASTY WITH INFERIOR TURBINATE REDUCTION;  Surgeon: Milissa Hamming, MD;  Location: [facility];  Service: ENT;  Laterality: Bilateral;  PLACED DISK ON OR CHARGE NURSE DESK 10-19 KP   REDUCTION MAMMAPLASTY Bilateral 2016   TUBAL LIGATION      Medications Prior to Admission  Medication Sig Dispense Refill Last Dose/Taking   buPROPion  (WELLBUTRIN  XL) 300 MG 24 hr tablet Take 150 mg by mouth 2 (two) times daily.      calcium gluconate 500 MG tablet Take 1 tablet by mouth daily.      levothyroxine  (SYNTHROID ) 175 MCG tablet Take 175 mcg by mouth daily.      metroNIDAZOLE  (METROCREAM ) 0.75 % cream Apply topically 2 (two) times daily. 30 g 5    Multiple Vitamin (MULTIVITAMIN) tablet Take 1 tablet by mouth daily.      QULIPTA 60 MG TABS Take 1 tablet by mouth daily.      vitamin B-12 (CYANOCOBALAMIN) 1000 MCG tablet Take 1,000 mcg by mouth daily.      Social History   Socioeconomic History   Marital status: Married    Spouse name: Not on file   Number of children: Not on file   Years of education: Not on file   Highest education level: Not on file  Occupational History   Not on file  Tobacco Use   Smoking status: Never   Smokeless tobacco: Never  Vaping Use   Vaping status: Never Used  Substance and Sexual Activity   Alcohol use: Yes    Comment: Rare   Drug use: Never   Sexual  activity: Yes    Birth control/protection: Surgical  Other Topics Concern   Not on file  Social History Narrative   Not on file   Social Drivers of Health   Financial Resource Strain: Low Risk  (08/23/2023)   Received from [facility]   Overall Financial Resource Strain (CARDIA)    Difficulty of Paying Living Expenses: Not hard at all  Food Insecurity: No Food Insecurity (07/03/2024)   Hunger Vital Sign    Worried About Running Out of Food in the Last Year: Never true    Ran Out of Food in the Last Year: Never true  Transportation Needs: No Transportation Needs (07/03/2024)   PRAPARE - Administrator, Civil Service (Medical): No    Lack of Transportation (Non-Medical): No  Physical Activity: Not on file  Stress: Not on file  Social Connections: Not on file  Intimate Partner Violence: Not At Risk (07/03/2024)   Humiliation, Afraid, Rape,  and Kick questionnaire    Fear of Current or Ex-Partner: No    Emotionally Abused: No    Physically Abused: No    Sexually Abused: No    Family History  Problem Relation Age of Onset   Breast cancer Neg Hx      Vitals:   07/03/24 0500 07/03/24 0600 07/03/24 0700 07/03/24 0800  BP: 122/67 123/65 112/60 128/70  Pulse: 71 67 62 64  Resp: 13 (!) 0 (!) 9 16  Temp:      TempSrc:      SpO2: 100% 99% 97% 97%  Weight:      Height:        PHYSICAL EXAM General: Well appearing female, well nourished, in no acute distress. HEENT: Normocephalic and atraumatic. Neck: No JVD.   Lungs: Normal respiratory effort on room air. Clear bilaterally to auscultation. No wheezes, crackles, rhonchi.  Heart: HRRR. Normal S1 and S2, + 3/6 systolic murmur.  Abdomen: Non-distended appearing.  Msk: Normal strength and tone for age. Extremities: Warm and well perfused. No clubbing, cyanosis, edema.  Neuro: Alert and oriented X 3. Psych: Answers questions appropriately.   Labs: Basic Metabolic Panel: Recent Labs    07/03/24 0232  07/03/24 0538  NA 138 139  K 3.5 4.5  CL 104 104  CO2 20* 26  GLUCOSE 131* 122*  BUN 16 13  CREATININE 1.00 0.76  CALCIUM 9.6 9.1   Liver Function Tests: Recent Labs    07/03/24 0232  AST 34  ALT 27  ALKPHOS 64  BILITOT 0.6  PROT 7.1  ALBUMIN 4.3   Recent Labs    07/03/24 0232  LIPASE 72*   CBC: Recent Labs    07/03/24 0232 07/03/24 0538  WBC 9.8 8.1  HGB 14.9 13.5  HCT 44.2 42.2  MCV 90.6 94.2  PLT 261 205   Cardiac Enzymes: Recent Labs    07/03/24 0232 07/03/24 0538  TROPONINIHS 33* 12,528*   BNP: No results for input(s): BNP in the last 72 hours. D-Dimer: No results for input(s): DDIMER in the last 72 hours. Hemoglobin A1C: No results for input(s): HGBA1C in the last 72 hours. Fasting Lipid Panel: Recent Labs    07/03/24 0538  CHOL 139  HDL 77  LDLCALC 58  TRIG 18  CHOLHDL 1.8   Thyroid  Function Tests: Recent Labs    07/03/24 0538  TSH 4.721*   Anemia Panel: No results for input(s): VITAMINB12, FOLATE, FERRITIN, TIBC, IRON, RETICCTPCT in the last 72 hours.   Radiology: CARDIAC CATHETERIZATION Result Date: 07/03/2024 Conclusions: Severe single-vessel coronary artery disease with occlusion of OM3 and irregular narrowing of distal LCx.  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  Otherwise, no angiographically significant CAD. Mildly reduced left ventricular systolic function (LVEF 45-50%) with akinesis of the mid lateral segment. Borderline elevated left ventricular filling pressure with LVEDP of 15 mmHg, increasing to 30 mmHg post a-wave suggesting underlying diastolic dysfunction. Severe aortic valve stenosis with mean gradient of 58 mmHg. 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  Avoid anticoagulation to minimize risk for further propagation. Check lipid panel (LDL previously well-controlled).  Defer statin for the time being given  theoretical risk for increasing potential for SCAD. Obtain echocardiogram. Recommend nonemergent CTA neck, chest, abdomen, and pelvis to evaluate for evidence of fibromuscular dysplasia. Avoid aggressive administration of nitrates and other preload/afterload lowering medications in the setting of severe AS.  Patient will need to be evaluated in multidisciplinary structural heart  clinic to determine best timing and approach for management of her aortic valve disease. Lonni Hanson, MD [facility]  DG Chest Port 1 View Result Date: 07/03/2024 CLINICAL DATA:  Chest pain and shortness of breath EXAM: PORTABLE CHEST 1 VIEW COMPARISON:  05/06/2011 FINDINGS: The heart size and mediastinal contours are within normal limits. Both lungs are clear. The visualized skeletal structures are unremarkable. IMPRESSION: No active disease. Electronically Signed   By: Oneil Devonshire M.D.   On: 07/03/2024 03:03   MM 3D DIAGNOSTIC MAMMOGRAM BILATERAL BREAST Result Date: 06/18/2024 CLINICAL DATA:  Two year follow-up for probably benign calcifications in the right breast. Patient is due for annual examination of the left breast. EXAM: DIGITAL DIAGNOSTIC BILATERAL MAMMOGRAM WITH TOMOSYNTHESIS AND CAD TECHNIQUE: Bilateral digital diagnostic mammography and breast tomosynthesis was performed. The images were evaluated with computer-aided detection. COMPARISON:  Previous exam(s). ACR Breast Density Category b: There are scattered areas of fibroglandular density. FINDINGS: Spot magnification views of the right breast demonstrate a 3 mm group coarse heterogeneous/dystrophic calcification in the upper far outer position posterior depth. This is mammographically stable in size compared to August 2023, with benign interval coarsening. Given this documented 2 year stability, this is consistent with a benign etiology. No new suspicious mass, calcification, or other findings are identified in bilateral breasts. IMPRESSION: 1. Benign  calcifications in the RIGHT breast. 2. No mammographic evidence of malignancy in BILATERAL breasts. RECOMMENDATION: Return to routine screening mammography is recommended. The patient will be due for screening in July 2026. I have discussed the findings and recommendations with the patient. If applicable, a reminder letter will be sent to the patient regarding the next appointment. BI-RADS CATEGORY  2: Benign. Electronically Signed   By: Dirk Arrant M.D.   On: 06/18/2024 09:53    ECHO ordered  TELEMETRY reviewed by me 07/03/2024: sinus rhythm, rate 60s  EKG reviewed by me: sinus rhythm with PACs, arte 61 bpm  Data reviewed by me 07/03/2024: last 24h vitals tele labs imaging I/O ED provider note, admission H&P, STEMI note  Active Problems:   Anxiety and depression   History of CVA with residual deficit   History of migraine   History of sarcoidosis   Hypothyroidism following radioiodine therapy   S/P gastric bypass   ST elevation myocardial infarction involving left circumflex coronary artery (HCC)   Coronary artery dissection    ASSESSMENT AND PLAN:  Kara Kirk is a 63 y.o. female  with a past medical hypertension, migraine headaches, sarcoidosis, history of hx remote stroke (chronic Plavix), moderate to severe aortic stenosis, moderate AR, Moderate PR, morbid obesity s/p bariatric surgery, hypothyroidism who presented to the ED on 07/03/2024 for sudden onset severe central chest pain that radiated between shoulder blades. EKG in ED revealed less than 1 mm of ST elevation in the inferolateral leads as well as some ST depression in the anterior leads. Chest pain got worse, repeat EKG revealed  1 mm of ST elevation in V4 through V6, diagnostic for STEMI. Patient underwent emergent LHC with Dr. Hanson that suggested suspicion for SCAD, otherwise no significant CAD noted. Also severe AS with mean gradient of 58 mmHg. Patient see's Dr. Florencio outpatient. [facility] resume care.   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 Avoid nitroglycerin  due to setting of severe AS.  -Continue ASA 81 mg daily. -Continue metoprolol  tartrate 12.5 mg BID. -Avoid AC given risk of propagation SCAD. -Avoid  statin initiation with Lipids within normal limits and possible risk of SCASD with statins.  -Will consider performing CTA neck. Chest, abdomen, pelvis to screen for fibromuscular dysplasia tomorrow if patient remains stable.  # Heart Failure with mrEF due to ischemic cardiomyopathy  -Echo ordered. Further recommendations pending results.  -Continue to monitor volume status.  -Continue metoprolol  as stated above. -Will plan to initiate SGLT2i tomorrow if renal function allows. (Copay $30)  # Severe Aortic Stenosis  -Echo ordered. Further recommendations pending results.  -Will minimize preload and afterload reducing agents. -Will plan to schedule close follow-up outpatient with Dr. Katina [facility]) for further evaluation of AS and concern for congenitally malformed aortic valve.    This patient's plan of care was discussed and created with Dr. Ammon and he is in agreement.  Signed: Dorene Comfort, PA-C  07/03/2024, 8:28 AM [facility]

## 2024-07-03 NOTE — Assessment & Plan Note (Signed)
Continue home meds pending med rec 

## 2024-07-03 NOTE — Progress Notes (Addendum)
 No family at bedside. Patient seen chart reviewed patient came in with chest pain along with STT changes on EKG. Code STEMI was initiated. Patient underwent emergent cardiac cath revealed spontaneous dissection of distal LCx for which medical management, including avoiding anticoagulation or aggressive administration of nitrates was recommended.  PRN morphine  for pain. Okay to transfer to cardiac telemetry unit per cardiology. Echo pending continue low-dose beta-blockers and ASA  no charge

## 2024-07-03 NOTE — Assessment & Plan Note (Signed)
 Continue bupropion 

## 2024-07-03 NOTE — Plan of Care (Signed)

## 2024-07-03 NOTE — Progress Notes (Addendum)
 Pt complained of nausea, which was relieved with Zofran . When asked about pain admitted to 2/10 right side chest pain, and increased to 3/10 CP described as pressure; non-radiating. Denied SOB; O2 sat 99% on RA. 12 lead EKG done, in electronic record. Dr. Tobie notified and cardiology also notified. States CP was easing off at the time of this writing.

## 2024-07-03 NOTE — Assessment & Plan Note (Addendum)
 Coronary artery dissection of distal LCX Aortic stenosis Medical management per cardiology Consult placed to [facility] clinic to follow Avoiding aggressive nitrates per cardiology recommendation Avoiding anticoagulation Echocardiogram ordered by Dr. Mady to evaluate for structural abnormalities Orders as entered by Dr. Ninfa  81, metoprolol , NTG as needed(minimize use) and morphine 

## 2024-07-03 NOTE — Assessment & Plan Note (Signed)
 Continue aspirin 

## 2024-07-03 NOTE — H&P (Signed)
 History and Physical    Patient: Kara Kirk FMW:[id number] DOB: 1961/05/19 DOA: 07/03/2024 DOS: the patient was seen and examined on 07/03/2024 PCP: Alla Amis, MD  Patient coming from: Home  Chief Complaint:  Chief Complaint  Patient presents with   Chest Pain   HPI: Kara Kirk is a 63 y.o. female with medical history significant of Multiple strokes with residual left-sided numbness, sarcoidosis, postablative hypothyroidism, headaches, history of gastric bypass, anxiety/depression/chronic fatigue, aortic stenosis, being admitted to the hospitalist service s/p emergent cardiac catheterization for suspected STEMI of LCA.  Cath revealed spontaneous dissection of distal LCx for which medical management, including avoiding anticoagulation or aggressive administration of nitrates was recommended.  She was transferred to the stepdown unit in stable condition and with improvement in pain from 6 out of 10 on arrival to 1 out of 10 postprocedure. Patient seen in stepdown unit with husband and daughter at bedside.  She is comfortable and denies complaints.    Past Medical History:  Diagnosis Date   Allergy    Anxiety    Chronic kidney disease    COVID-19 08/10/2020   Loss of taste and smell.  Fatigue.  All resolved by 4 weeks.   CRI (chronic renal insufficiency), stage 3 (moderate) (HCC)    Depression    DJD (degenerative joint disease)    Fatty liver disease, nonalcoholic    GERD (gastroesophageal reflux disease)    Heart murmur    Hypertension    Hypothyroidism    Migraine headache    1-2/month   Obesity    s/p lap band   PONV (postoperative nausea and vomiting)    Sarcoidosis    Stroke (HCC) 1990   Jan/Feb  6 Mini-strokes, then 1 larger stroke that resulted in some left sided numbness   Vertigo    2-3 episodes/year   Wears contact lenses    Past Surgical History:  Procedure Laterality Date   ABDOMINAL HYSTERECTOMY     ABDOMINOPLASTY     COLONOSCOPY      COLONOSCOPY WITH PROPOFOL  N/A 09/17/2021   Procedure: COLONOSCOPY WITH PROPOFOL ;  Surgeon: Maryruth Ole DASEN, MD;  Location: [facility] ENDOSCOPY;  Service: Endoscopy;  Laterality: N/A;   ESOPHAGOGASTRODUODENOSCOPY     LAPAROSCOPIC GASTRIC BANDING     Removal 07/2020   LAPAROSCOPIC OOPHERECTOMY     LUNG BIOPSY     lysis fallopian tube adhesion  08/03/2020   NASAL SEPTOPLASTY W/ TURBINOPLASTY Bilateral 11/03/2021   Procedure: NASAL SEPTOPLASTY WITH INFERIOR TURBINATE REDUCTION;  Surgeon: Milissa Hamming, MD;  Location: [facility];  Service: ENT;  Laterality: Bilateral;  PLACED DISK ON OR CHARGE NURSE DESK 10-19 KP   REDUCTION MAMMAPLASTY Bilateral 2016   TUBAL LIGATION     Social History:  reports that she has never smoked. She has never used smokeless tobacco. She reports current alcohol use. She reports that she does not use drugs.  Allergies  Allergen Reactions   Penicillin G Other (See Comments)    Other Reaction: labored breathing.   Penicillins Shortness Of Breath   Doxycycline  Other (See Comments)    GI upset    Family History  Problem Relation Age of Onset   Breast cancer Neg Hx     Prior to Admission medications   Medication Sig Start Date End Date Taking? Authorizing Provider  buPROPion  (WELLBUTRIN  XL) 300 MG 24 hr tablet Take 150 mg by mouth 2 (two) times daily.    [provider]  calcium gluconate 500 MG tablet Take  1 tablet by mouth daily.    [provider]  levothyroxine  (SYNTHROID ) 175 MCG tablet Take 175 mcg by mouth daily.    [provider]  metroNIDAZOLE  (METROCREAM ) 0.75 % cream Apply topically 2 (two) times daily. 08/22/23 08/21/24  Claudene Lehmann, MD  Multiple Vitamin (MULTIVITAMIN) tablet Take 1 tablet by mouth daily.    [provider]  QULIPTA 60 MG TABS Take 1 tablet by mouth daily. 07/07/22   [provider]  vitamin B-12 (CYANOCOBALAMIN) 1000 MCG tablet Take 1,000 mcg by mouth daily.    [provider]    Physical Exam: Vitals:   07/03/24 0421 07/03/24 0426 07/03/24 0449 07/03/24 0500  BP: (!) 141/64 (!) 153/72 139/81 122/67  Pulse: 76 77 80 71  Resp: 15 18  13   Temp:   97.6 F (36.4 C)   TempSrc:   Oral   SpO2: 99% 100% 100% 100%  Weight:   63.1 kg   Height:   5' 1 (1.549 m)    Physical Exam Vitals and nursing note reviewed.  Constitutional:      General: She is not in acute distress. HENT:     Head: Normocephalic and atraumatic.  Cardiovascular:     Rate and Rhythm: Normal rate and regular rhythm.     Heart sounds: Normal heart sounds.  Pulmonary:     Effort: Pulmonary effort is normal.     Breath sounds: Normal breath sounds.  Abdominal:     Palpations: Abdomen is soft.     Tenderness: There is no abdominal tenderness.  Neurological:     Mental Status: Mental status is at baseline.     Data Reviewed: Relevant notes from primary care and specialist visits, past discharge summaries as available in EHR, including Care Everywhere. Prior diagnostic testing as pertinent to current admission diagnoses Updated medications and problem lists for reconciliation ED course, including vitals, labs, imaging, treatment and response to treatment Triage notes, nursing and pharmacy notes and ED provider's notes Notable results as noted in HPI   Assessment and Plan: ST elevation myocardial infarction involving left circumflex coronary artery [facility]) Coronary artery dissection of distal LCX Aortic stenosis Medical management per cardiology Consult placed to [facility] clinic to follow Avoiding aggressive nitrates per cardiology recommendation Avoiding anticoagulation Echocardiogram ordered by Dr. Mady to evaluate for structural abnormalities Orders as entered by Dr. Sidonie  81, metoprolol , NTG as needed(minimize use) and morphine    S/P gastric bypass No acute issues suspected  Hypothyroidism following radioiodine therapy Continue levothyroxine   History of  sarcoidosis No acute issues suspected  History of migraine Continue home meds pending med rec  History of CVA with residual deficit Continue aspirin   Anxiety and depression Continue bupropion       Advance Care Planning:   Code Status: Full Code   Consults: [facility] cardiology  Family Communication: Husband and daughter at bedside  Severity of Illness: The appropriate patient status for this patient is INPATIENT. Inpatient status is judged to be reasonable and necessary in order to provide the required intensity of service to ensure the patient's safety. The patient's presenting symptoms, physical exam findings, and initial radiographic and laboratory data in the context of their chronic comorbidities is felt to place them at high risk for further clinical deterioration. Furthermore, it is not anticipated that the patient will be medically stable for discharge from the hospital within 2 midnights of admission.   * I certify that at the point of admission it is my clinical judgment that the  patient will require inpatient hospital care spanning beyond 2 midnights from the point of admission due to high intensity of service, high risk for further deterioration and high frequency of surveillance required.*  Author: Delayne LULLA Solian, MD 07/03/2024 5:22 AM  For on call review [email or web address].

## 2024-07-04 ENCOUNTER — Inpatient Hospital Stay
Admit: 2024-07-04 | Discharge: 2024-07-04 | Disposition: A | Discharge status: Home / Self Care | Attending: Student | Admitting: Student

## 2024-07-04 DIAGNOSIS — I2542 Coronary artery dissection: Secondary | ICD-10-CM | POA: Diagnosis not present

## 2024-07-04 LAB — CBC
HCT: 41.1 % (ref 36.0–46.0)
Hemoglobin: 13.5 g/dL (ref 12.0–15.0)
MCH: 30.5 pg (ref 26.0–34.0)
MCHC: 32.8 g/dL (ref 30.0–36.0)
MCV: 93 fL (ref 80.0–100.0)
Platelets: 195 K/uL (ref 150–400)
RBC: 4.42 MIL/uL (ref 3.87–5.11)
RDW: 13.6 % (ref 11.5–15.5)
WBC: 9.6 K/uL (ref 4.0–10.5)
nRBC: 0 % (ref 0.0–0.2)

## 2024-07-04 LAB — BASIC METABOLIC PANEL WITH GFR
Anion gap: 9 (ref 5–15)
BUN: 10 mg/dL (ref 8–23)
CO2: 28 mmol/L (ref 22–32)
Calcium: 9.1 mg/dL (ref 8.9–10.3)
Chloride: 103 mmol/L (ref 98–111)
Creatinine, Ser: 0.73 mg/dL (ref 0.44–1.00)
GFR, Estimated: >60 mL/min (ref >60)
Glucose, Bld: 107 mg/dL — ABNORMAL HIGH (ref 70–99)
Potassium: 4 mmol/L (ref 3.5–5.1)
Sodium: 140 mmol/L (ref 135–145)

## 2024-07-04 MED ORDER — DAPAGLIFLOZIN PROPANEDIOL 10 MG PO TABS
10.0000 mg | ORAL_TABLET | Freq: Every day | ORAL | Status: DC
  Administered 2024-07-04 – 2024-07-05 (×2): 10 mg via ORAL
  Filled 2024-07-04 (×2): qty 1

## 2024-07-04 MED ORDER — SPIRONOLACTONE 25 MG PO TABS
25.0000 mg | ORAL_TABLET | Freq: Every day | ORAL | Status: DC
  Administered 2024-07-04 – 2024-07-05 (×2): 25 mg via ORAL
  Filled 2024-07-04 (×2): qty 1

## 2024-07-04 NOTE — Hospital Course (Signed)
 63yo with h/o recurrent CVA with resultant L-sided numbness, sarcoidosis, postablative hypothyroidism, s/p gastric bypass, AS, and anxiety/depression/chronic fatigue syndrome who presented on 8/13 with chest pain.  She was taken emergently to the cath lab for STEMI and cath revealed spontaneous dissection of distal LCx for which medical management was recommended.

## 2024-07-04 NOTE — Progress Notes (Signed)
 2D echocardiogram completed.

## 2024-07-04 NOTE — Progress Notes (Addendum)
 [facility] CLINIC CARDIOLOGY PROGRESS NOTE       Patient ID: Kara Kirk MRN: [id number] DOB/AGE: 05/15/1961 63 y.o.  Admit date: 07/03/2024 Referring Physician Dr. Mady Primary Physician Alla Amis, MD Primary Cardiologist Dr. Florencio (2024) Reason for Consultation STEMI   HPI: Kara Kirk is a 63 y.o. female  with a past medical hypertension, migraine headaches, sarcoidosis, history of hx remote stroke (chronic Plavix), moderate to severe aortic stenosis, moderate AR, Moderate PR, morbid obesity s/p bariatric surgery, hypothyroidism who presented to the ED on 07/03/2024 for sudden onset severe central chest pain that radiated between shoulder blades. EKG in ED revealed less than 1 mm of ST elevation in the inferolateral leads as well as some ST depression in the anterior leads. Chest pain got worse, repeat EKG revealed  1 mm of ST elevation in V4 through V6, diagnostic for STEMI. Patient underwent emergent LHC with Dr. Mady that suggested suspicion for SCAD, otherwise no significant CAD noted. Also severe AS with mean gradient of 58 mmHg. Patient see's Dr. Florencio outpatient. [facility] Cardiology resume care.   Interval History: -Patient seen and examined this AM and laying comfortably in hospital bed. Patient states she feels better today.  Patient states she has not had any more severe chest pain episodes since yesterday evening.  Continues to deny SOB or lightheadedness.  -Patient appears euvolemic. -Patients BP and HR stable this AM. Overnight Tele showed no significant events.  -Patient remains on room air with stable SpO2.  -Incision site is clean and dry with no evidence of significant swelling, bruising, or active bleeding.   Pertinent Cardiac History (Most recent) LHC (07/03/2024 with Dr. Mady) Severe single-vessel coronary artery disease with occlusion of OM3 and irregular narrowing of distal LCx.  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   Otherwise, no angiographically significant CAD. Mildly reduced left ventricular systolic function (LVEF 45-50%) with akinesis of the mid lateral segment. Borderline elevated left ventricular filling pressure with LVEDP of 15 mmHg, increasing to 30 mmHg post a-wave suggesting underlying diastolic dysfunction. Severe aortic valve stenosis with mean gradient of 58 mmHg.  Echo (08/2023) MILD LEFT VENTRICULAR SYSTOLIC DYSFUNCTION WITH MILD LVH  ESTIMATED EF: 50%, CALC EF(2D): 50%  NORMAL LA PRESSURES WITH DIASTOLIC DYSFUNCTION  NORMAL RIGHT VENTRICULAR SYSTOLIC FUNCTION  VALVULAR REGURGITATION: MODERATE AR, TRIVIAL MR, MODERATE PR, TRIVIAL TR  VALVULAR STENOSIS: MODERATE AS, No MS, No PS, No TS   AORTIC STENOSIS GRADIENTS INCREASED SINCE PREVIOUS ECHO  AV Peak: 4.30m/s; AVA: 0.9cm^2 ; DI: 0.22  (Prev. exam AoV PV: 2.52m/s: no AVA or DI recorded)   Review of systems complete and found to be negative unless listed above    Past Medical History:  Diagnosis Date   Allergy    Anxiety    Chronic kidney disease    COVID-19 08/10/2020   Loss of taste and smell.  Fatigue.  All resolved by 4 weeks.   CRI (chronic renal insufficiency), stage 3 (moderate) (HCC)    Depression    DJD (degenerative joint disease)    Fatty liver disease, nonalcoholic    GERD (gastroesophageal reflux disease)    Heart murmur    Hypertension    Hypothyroidism    Migraine headache    1-2/month   Obesity    s/p lap band   PONV (postoperative nausea and vomiting)    Sarcoidosis    Stroke (HCC) 1990   Jan/Feb  6 Mini-strokes, then 1 larger stroke that resulted in some left sided numbness  Vertigo    2-3 episodes/year   Wears contact lenses     Past Surgical History:  Procedure Laterality Date   ABDOMINAL HYSTERECTOMY     ABDOMINOPLASTY     COLONOSCOPY     COLONOSCOPY WITH PROPOFOL  N/A 09/17/2021   Procedure: COLONOSCOPY WITH PROPOFOL ;  Surgeon: Maryruth Ole DASEN, MD;  Location: [facility] ENDOSCOPY;   Service: Endoscopy;  Laterality: N/A;   CORONARY/GRAFT ACUTE MI REVASCULARIZATION N/A 07/03/2024   Procedure: Coronary/Graft Acute MI Revascularization;  Surgeon: Mady Bruckner, MD;  Location: [facility] INVASIVE CV LAB;  Service: Cardiovascular;  Laterality: N/A;   ESOPHAGOGASTRODUODENOSCOPY     LAPAROSCOPIC GASTRIC BANDING     Removal 07/2020   LAPAROSCOPIC OOPHERECTOMY     LEFT HEART CATH AND CORONARY ANGIOGRAPHY N/A 07/03/2024   Procedure: LEFT HEART CATH AND CORONARY ANGIOGRAPHY;  Surgeon: Mady Bruckner, MD;  Location: [facility] INVASIVE CV LAB;  Service: Cardiovascular;  Laterality: N/A;   LUNG BIOPSY     lysis fallopian tube adhesion  08/03/2020   NASAL SEPTOPLASTY W/ TURBINOPLASTY Bilateral 11/03/2021   Procedure: NASAL SEPTOPLASTY WITH INFERIOR TURBINATE REDUCTION;  Surgeon: Milissa Hamming, MD;  Location: [facility];  Service: ENT;  Laterality: Bilateral;  PLACED DISK ON OR CHARGE NURSE DESK 10-19 KP   REDUCTION MAMMAPLASTY Bilateral 2016   TUBAL LIGATION      Medications Prior to Admission  Medication Sig Dispense Refill Last Dose/Taking   buPROPion  (WELLBUTRIN  XL) 300 MG 24 hr tablet Take 150 mg by mouth 2 (two) times daily.      calcium gluconate 500 MG tablet Take 1 tablet by mouth daily.      levothyroxine  (SYNTHROID ) 175 MCG tablet Take 175 mcg by mouth daily.      metroNIDAZOLE  (METROCREAM ) 0.75 % cream Apply topically 2 (two) times daily. 30 g 5    Multiple Vitamin (MULTIVITAMIN) tablet Take 1 tablet by mouth daily.      QULIPTA 60 MG TABS Take 1 tablet by mouth daily.      vitamin B-12 (CYANOCOBALAMIN) 1000 MCG tablet Take 1,000 mcg by mouth daily.      Social History   Socioeconomic History   Marital status: Married    Spouse name: Not on file   Number of children: Not on file   Years of education: Not on file   Highest education level: Not on file  Occupational History   Not on file  Tobacco Use   Smoking status: Never   Smokeless tobacco: Never  Vaping  Use   Vaping status: Never Used  Substance and Sexual Activity   Alcohol use: Yes    Comment: Rare   Drug use: Never   Sexual activity: Yes    Birth control/protection: Surgical  Other Topics Concern   Not on file  Social History Narrative   Not on file   Social Drivers of Health   Financial Resource Strain: Low Risk  (08/23/2023)   Received from [facility]   Overall Financial Resource Strain (CARDIA)    Difficulty of Paying Living Expenses: Not hard at all  Food Insecurity: No Food Insecurity (07/03/2024)   Hunger Vital Sign    Worried About Running Out of Food in the Last Year: Never true    Ran Out of Food in the Last Year: Never true  Transportation Needs: No Transportation Needs (07/03/2024)   PRAPARE - Administrator, Civil Service (Medical): No    Lack of Transportation (Non-Medical): No  Physical Activity: Not on file  Stress: Not on file  Social Connections: Not on file  Intimate Partner Violence: Not At Risk (07/03/2024)   Humiliation, Afraid, Rape, and Kick questionnaire    Fear of Current or Ex-Partner: No    Emotionally Abused: No    Physically Abused: No    Sexually Abused: No    Family History  Problem Relation Age of Onset   Breast cancer Neg Hx      Vitals:   07/04/24 1000 07/04/24 1023 07/04/24 1100 07/04/24 1200  BP:  (!) 119/45  130/65  Pulse: 67 69 65 62  Resp: 15  14 14   Temp:      TempSrc:      SpO2: 99%  98% 97%  Weight:      Height:        PHYSICAL EXAM General: Well appearing female, well nourished, in no acute distress. HEENT: Normocephalic and atraumatic. Neck: No JVD.   Lungs: Normal respiratory effort on room air. Clear bilaterally to auscultation. No wheezes, crackles, rhonchi.  Heart: HRRR. Normal S1 and S2, + 3/6 systolic murmur.  Abdomen: Non-distended appearing.  Msk: Normal strength and tone for age. Extremities: Warm and well perfused. No clubbing, cyanosis, edema (baseline RLE trace  edema) Neuro: Alert and oriented X 3. Psych: Answers questions appropriately.   Labs: Basic Metabolic Panel: Recent Labs    07/03/24 0538 07/04/24 0256  NA 139 140  K 4.5 4.0  CL 104 103  CO2 26 28  GLUCOSE 122* 107*  BUN 13 10  CREATININE 0.76 0.73  CALCIUM 9.1 9.1   Liver Function Tests: Recent Labs    07/03/24 0232  AST 34  ALT 27  ALKPHOS 64  BILITOT 0.6  PROT 7.1  ALBUMIN 4.3   Recent Labs    07/03/24 0232  LIPASE 72*   CBC: Recent Labs    07/03/24 0538 07/04/24 0256  WBC 8.1 9.6  HGB 13.5 13.5  HCT 42.2 41.1  MCV 94.2 93.0  PLT 205 195   Cardiac Enzymes: Recent Labs    07/03/24 0232 07/03/24 0538  TROPONINIHS 33* 12,528*   BNP: No results for input(s): BNP in the last 72 hours. D-Dimer: No results for input(s): DDIMER in the last 72 hours. Hemoglobin A1C: Recent Labs    07/03/24 0539  HGBA1C 5.1   Fasting Lipid Panel: Recent Labs    07/03/24 0538  CHOL 139  HDL 77  LDLCALC 58  TRIG 18  CHOLHDL 1.8   Thyroid  Function Tests: Recent Labs    07/03/24 0538  TSH 4.721*   Anemia Panel: No results for input(s): VITAMINB12, FOLATE, FERRITIN, TIBC, IRON, RETICCTPCT in the last 72 hours.   Radiology: CARDIAC CATHETERIZATION Result Date: 07/03/2024 Conclusions: Severe single-vessel coronary artery disease with occlusion of OM3 and irregular narrowing of distal LCx.  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  Otherwise, no angiographically significant CAD. Mildly reduced left ventricular systolic function (LVEF 45-50%) with akinesis of the mid lateral segment. Borderline elevated left ventricular filling pressure with LVEDP of 15 mmHg, increasing to 30 mmHg post a-wave suggesting underlying diastolic dysfunction. Severe aortic valve stenosis with mean gradient of 58 mmHg. 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  Avoid anticoagulation to  minimize risk for further propagation. Check lipid panel (LDL previously well-controlled).  Defer statin for the time being given theoretical risk for increasing potential for SCAD. Obtain echocardiogram. Recommend nonemergent CTA neck, chest, abdomen, and pelvis to evaluate for evidence of fibromuscular dysplasia.  Avoid aggressive administration of nitrates and other preload/afterload lowering medications in the setting of severe AS.  Patient will need to be evaluated in multidisciplinary structural heart clinic to determine best timing and approach for management of her aortic valve disease. Lonni Hanson, MD [facility]  DG Chest Port 1 View Result Date: 07/03/2024 CLINICAL DATA:  Chest pain and shortness of breath EXAM: PORTABLE CHEST 1 VIEW COMPARISON:  05/06/2011 FINDINGS: The heart size and mediastinal contours are within normal limits. Both lungs are clear. The visualized skeletal structures are unremarkable. IMPRESSION: No active disease. Electronically Signed   By: Oneil Devonshire M.D.   On: 07/03/2024 03:03   MM 3D DIAGNOSTIC MAMMOGRAM BILATERAL BREAST Result Date: 06/18/2024 CLINICAL DATA:  Two year follow-up for probably benign calcifications in the right breast. Patient is due for annual examination of the left breast. EXAM: DIGITAL DIAGNOSTIC BILATERAL MAMMOGRAM WITH TOMOSYNTHESIS AND CAD TECHNIQUE: Bilateral digital diagnostic mammography and breast tomosynthesis was performed. The images were evaluated with computer-aided detection. COMPARISON:  Previous exam(s). ACR Breast Density Category b: There are scattered areas of fibroglandular density. FINDINGS: Spot magnification views of the right breast demonstrate a 3 mm group coarse heterogeneous/dystrophic calcification in the upper far outer position posterior depth. This is mammographically stable in size compared to August 2023, with benign interval coarsening. Given this documented 2 year stability, this is consistent with a benign  etiology. No new suspicious mass, calcification, or other findings are identified in bilateral breasts. IMPRESSION: 1. Benign calcifications in the RIGHT breast. 2. No mammographic evidence of malignancy in BILATERAL breasts. RECOMMENDATION: Return to routine screening mammography is recommended. The patient will be due for screening in July 2026. I have discussed the findings and recommendations with the patient. If applicable, a reminder letter will be sent to the patient regarding the next appointment. BI-RADS CATEGORY  2: Benign. Electronically Signed   By: Dirk Arrant M.D.   On: 06/18/2024 09:53    ECHO ordered  TELEMETRY reviewed by me 07/04/2024: sinus rhythm, rate 70s  EKG reviewed by me: sinus rhythm with PACs, arte 61 bpm  Data reviewed by me 07/04/2024: last 24h vitals tele labs imaging I/O hospitalist progress notes.  Active Problems:   Anxiety and depression   History of CVA with residual deficit   History of migraine   History of sarcoidosis   Hypothyroidism following radioiodine therapy   S/P gastric bypass   ST elevation myocardial infarction (STEMI) (HCC)   Coronary artery dissection    ASSESSMENT AND PLAN:  Kara Kirk is a 63 y.o. female  with a past medical hypertension, migraine headaches, sarcoidosis, history of hx remote stroke (chronic Plavix), moderate to severe aortic stenosis, moderate AR, Moderate PR, morbid obesity s/p bariatric surgery, hypothyroidism who presented to the ED on 07/03/2024 for sudden onset severe central chest pain that radiated between shoulder blades. EKG in ED revealed less than 1 mm of ST elevation in the inferolateral leads as well as some ST depression in the anterior leads. Chest pain got worse, repeat EKG revealed  1 mm of ST elevation in V4 through V6, diagnostic for STEMI. Patient underwent emergent LHC with Dr. Hanson that suggested suspicion for SCAD, otherwise no significant CAD noted. Also severe AS with mean gradient of 58 mmHg.  Patient see's Dr. Florencio outpatient. [facility] Cardiology resume care.   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 Avoid nitroglycerin  due to setting of severe AS.  -  Continue ASA 81 mg daily. -Continue metoprolol  tartrate 12.5 mg BID. -Avoid AC given risk of propagation SCAD. -Avoid statin initiation with Lipids within normal limits and possible risk of SCASD with statins.  -Will consider performing CTA neck. Chest, abdomen, pelvis to screen for fibromuscular dysplasia tomorrow if patient remains stable.  # Heart Failure with mrEF due to ischemic cardiomyopathy  -Echo ordered. Further recommendations pending results.  -Continue to monitor volume status.  -Continue metoprolol  as stated above. -Ordered dapagliflozin  10 mg daily for GDMT optimization.  (Copay $30) -Orderespironolactone  25 mg daily. -Will consider ARB if BP becomes elevated.  # Severe Aortic Stenosis  -Echo ordered. Further recommendations pending results.  -Will minimize preload and afterload reducing agents. -Schedule follow-up outpatient with Dr. Katina [facility]) on 09/03 for further evaluation of AS and concern for congenitally malformed aortic valve.    Follow-up scheduled with Dr. Katina to discuss TAVR evaluation on 09/03 at 2:45 PM.   This patient's plan of care was discussed and created with Dr. Ammon and he is in agreement.  Signed: Dorene Comfort, PA-C  07/04/2024, 2:06 PM [facility]

## 2024-07-04 NOTE — Plan of Care (Signed)
  Problem: Education: Goal: Knowledge of General Education information will improve Description: Including pain rating scale, medication(s)/side effects and non-pharmacologic comfort measures Outcome: Progressing   Problem: Clinical Measurements: Goal: Ability to maintain clinical measurements within normal limits will improve Outcome: Progressing Goal: Will remain free from infection Outcome: Progressing Goal: Diagnostic test results will improve Outcome: Progressing Goal: Respiratory complications will improve Outcome: Progressing Goal: Cardiovascular complication will be avoided Outcome: Progressing   Problem: Activity: Goal: Risk for activity intolerance will decrease Outcome: Progressing   Problem: Nutrition: Goal: Adequate nutrition will be maintained Outcome: Progressing   Problem: Coping: Goal: Level of anxiety will decrease Outcome: Progressing   Problem: Elimination: Goal: Will not experience complications related to bowel motility Outcome: Progressing Goal: Will not experience complications related to urinary retention Outcome: Progressing   Problem: Pain Managment: Goal: General experience of comfort will improve and/or be controlled Outcome: Progressing   Problem: Safety: Goal: Ability to remain free from injury will improve Outcome: Progressing   Problem: Skin Integrity: Goal: Risk for impaired skin integrity will decrease Outcome: Progressing   Problem: Education: Goal: Understanding of CV disease, CV risk reduction, and recovery process will improve Outcome: Progressing   Problem: Activity: Goal: Ability to return to baseline activity level will improve Outcome: Progressing

## 2024-07-04 NOTE — Progress Notes (Signed)
 Progress Note   Patient: Kara Kirk FMW:[id number] DOB: 01-27-1961 DOA: 07/03/2024     1 DOS: the patient was seen and examined on 07/04/2024   Brief hospital course: 63yo with h/o recurrent CVA with resultant L-sided numbness, sarcoidosis, postablative hypothyroidism, s/p gastric bypass, AS, and anxiety/depression/chronic fatigue syndrome who presented on 8/13 with chest pain.  She was taken emergently to the cath lab for STEMI and cath revealed spontaneous dissection of distal LCx for which medical management was recommended.  Assessment and Plan:  ST elevation myocardial infarction involving left circumflex coronary artery/Coronary artery dissection of distal LCX S/p cath on 8/13 Medical management per cardiology Consult placed to [facility] clinic to follow Avoiding aggressive nitrates per cardiology recommendation Avoiding anticoagulation Echocardiogram ordered by Dr. Mady to evaluate for structural abnormalities Orders as entered by Dr. Hershell  81, metoprolol , NTG as needed(minimize use) and morphine   Aortic stenosis  Repeat echo is still pending Prior echo at [facility] in 08/2023 with EF 50%, moderate AR, moderate AS   S/P gastric bypass No acute issues suspected   Hypothyroidism following radioiodine therapy Continue levothyroxine  TSH is mildly elevated Recommended repeat TFTs in 4-6 weeks with possible dosage adjustment at that time vs. F/u with endocrinology sooner   History of sarcoidosis No acute issues suspected Not on chronic medications for this issue, it appears   History of migraine Takes Qulipta for prophylaxis at home   History of CVA with residual deficit Continue aspirin    Anxiety and depression Continue bupropion      Consultants: Cardiology  Procedures: Cardiac catheterization 8/13 Echocardiogram 8/14  Antibiotics: None  30 Day Unplanned Readmission Risk Score    Flowsheet Row ED to Hosp-Admission (Current) from [facility]03[facility]20[facility]  30 Day Unplanned Readmission Risk Score (%) 7.66 Filed at 07/04/2024 0801    This score is the patient's risk of an unplanned readmission within 30 days of being discharged (0 -100%). The score is based on dignosis, age, lab data, medications, orders, and past utilization.   Low:  0-14.9   Medium: 15-21.9   High: 22-29.9   Extreme: 30 and above           Subjective: Feels better today.  No further episodes of severe chest pain, although it feels a little like she pulled a muscle in her chest.   Objective: Vitals:   07/04/24 1100 07/04/24 1200  BP:  130/65  Pulse: 65 62  Resp: 14 14  Temp:    SpO2: 98% 97%    Intake/Output Summary (Last 24 hours) at 07/04/2024 1410 Last data filed at 07/04/2024 1025 Gross per 24 hour  Intake 3 ml  Output 350 ml  Net -347 ml   Filed Weights   07/03/24 0224 07/03/24 0449  Weight: 61.2 kg 63.1 kg    Exam:  General:  Appears calm and comfortable and is in NAD Eyes:  normal lids, iris ENT:  grossly normal hearing, lips & tongue, mmm Cardiovascular:  RRR, significant murmur best heard in aortic region. No LE edema.  Respiratory:   CTA bilaterally with no wheezes/rales/rhonchi.  Normal respiratory effort. Abdomen:  soft, NT, ND Skin:  no rash or induration seen on limited exam Musculoskeletal:  grossly normal tone BUE/BLE, good ROM, no bony abnormality Psychiatric:  grossly normal mood and affect, speech fluent and appropriate, AOx3 Neurologic:  CN 2-12 grossly intact, moves all extremities in coordinated fashion  Data Reviewed: I have reviewed the patient's lab results since admission.  Pertinent labs for  today include:   Glucose 107 Normal CBC TSH 4.721     Family Communication: Brother was present throughout evaluation  Disposition: Status is: Inpatient Remains inpatient appropriate because: ongoing monitoring     Time spent: 50 minutes  Unresulted Labs (From admission, onward)      Start     Ordered   07/05/24 0500  CBC with Differential/Platelet  Tomorrow morning,   R       Question:  Specimen collection method  Answer:  Lab=Lab collect   07/04/24 1410   07/05/24 0500  Basic metabolic panel with GFR  Tomorrow morning,   R       Question:  Specimen collection method  Answer:  Lab=Lab collect   07/04/24 1410   07/03/24 0507  Lipoprotein A (LPA)  Tomorrow morning,   R        07/03/24 9493             Author: Delon Herald, MD 07/04/2024 2:10 PM  For on call review [email or web address].

## 2024-07-05 DIAGNOSIS — I2121 ST elevation (STEMI) myocardial infarction involving left circumflex coronary artery: Secondary | ICD-10-CM | POA: Diagnosis not present

## 2024-07-05 LAB — ECHOCARDIOGRAM COMPLETE
AR max vel: 0.76 cm2
AV Area VTI: 0.86 cm2
AV Area mean vel: 0.74 cm2
AV Mean grad: 35 mmHg
AV Peak grad: 60.1 mmHg
Ao pk vel: 3.88 m/s
Area-P 1/2: 3.23 cm2
Height: 61 in
P 1/2 time: 454 ms
S' Lateral: 2.4 cm
Weight: 2225.76 [oz_av]

## 2024-07-05 LAB — CBC WITH DIFFERENTIAL/PLATELET
Abs Immature Granulocytes: 0.03 K/uL (ref 0.00–0.07)
Basophils Absolute: 0 K/uL (ref 0.0–0.1)
Basophils Relative: 0 %
Eosinophils Absolute: 0 K/uL (ref 0.0–0.5)
Eosinophils Relative: 0 %
HCT: 44.2 % (ref 36.0–46.0)
Hemoglobin: 14.5 g/dL (ref 12.0–15.0)
Immature Granulocytes: 0 %
Lymphocytes Relative: 19 %
Lymphs Abs: 2.2 K/uL (ref 0.7–4.0)
MCH: 30.7 pg (ref 26.0–34.0)
MCHC: 32.8 g/dL (ref 30.0–36.0)
MCV: 93.6 fL (ref 80.0–100.0)
Monocytes Absolute: 1.3 K/uL — ABNORMAL HIGH (ref 0.1–1.0)
Monocytes Relative: 11 %
Neutro Abs: 7.8 K/uL — ABNORMAL HIGH (ref 1.7–7.7)
Neutrophils Relative %: 70 %
Platelets: 202 K/uL (ref 150–400)
RBC: 4.72 MIL/uL (ref 3.87–5.11)
RDW: 13.4 % (ref 11.5–15.5)
WBC: 11.3 K/uL — ABNORMAL HIGH (ref 4.0–10.5)
nRBC: 0 % (ref 0.0–0.2)

## 2024-07-05 LAB — BASIC METABOLIC PANEL WITH GFR
Anion gap: 10 (ref 5–15)
BUN: 11 mg/dL (ref 8–23)
CO2: 28 mmol/L (ref 22–32)
Calcium: 9.2 mg/dL (ref 8.9–10.3)
Chloride: 101 mmol/L (ref 98–111)
Creatinine, Ser: 0.94 mg/dL (ref 0.44–1.00)
GFR, Estimated: >60 mL/min (ref >60)
Glucose, Bld: 105 mg/dL — ABNORMAL HIGH (ref 70–99)
Potassium: 3.9 mmol/L (ref 3.5–5.1)
Sodium: 139 mmol/L (ref 135–145)

## 2024-07-05 LAB — LIPOPROTEIN A (LPA): Lipoprotein (a): 163.5 nmol/L — ABNORMAL HIGH (ref <75.0)

## 2024-07-05 MED ORDER — DAPAGLIFLOZIN PROPANEDIOL 10 MG PO TABS: 10.0000 mg | ORAL_TABLET | Freq: Every day | ORAL | 0 refills | Status: DC

## 2024-07-05 MED ORDER — MELATONIN 5 MG PO TABS
10.0000 mg | ORAL_TABLET | Freq: Once | ORAL | Status: AC
  Administered 2024-07-05: 10 mg via ORAL
  Filled 2024-07-05: qty 2

## 2024-07-05 MED ORDER — ASPIRIN 81 MG PO CHEW: 81.0000 mg | CHEWABLE_TABLET | Freq: Every day | ORAL | 0 refills | Status: DC

## 2024-07-05 MED ORDER — SPIRONOLACTONE 25 MG PO TABS: 25.0000 mg | ORAL_TABLET | Freq: Every day | ORAL | 0 refills | Status: DC

## 2024-07-05 MED ORDER — METOPROLOL TARTRATE 25 MG PO TABS: 12.5000 mg | ORAL_TABLET | Freq: Two times a day (BID) | ORAL | 0 refills | Status: DC

## 2024-07-05 MED ORDER — THYROID 90 MG PO TABS
90.0000 mg | ORAL_TABLET | Freq: Every day | ORAL | 0 refills | Status: DC
Start: 2024-07-06 — End: 2024-08-28

## 2024-07-05 NOTE — Progress Notes (Signed)
 [facility] CLINIC CARDIOLOGY PROGRESS NOTE       Patient ID: Kara Kirk MRN: [id number] DOB/AGE: 1961/03/31 63 y.o.  Admit date: 07/03/2024 Referring Physician Dr. Mady Primary Physician Alla Amis, MD Primary Cardiologist Dr. Florencio (2024) Reason for Consultation STEMI   HPI: Kara Kirk is a 63 y.o. female  with a past medical hypertension, migraine headaches, sarcoidosis, history of hx remote stroke (chronic Plavix), moderate to severe aortic stenosis, moderate AR, Moderate PR, morbid obesity s/p bariatric surgery, hypothyroidism who presented to the ED on 07/03/2024 for sudden onset severe central chest pain that radiated between shoulder blades. EKG in ED revealed less than 1 mm of ST elevation in the inferolateral leads as well as some ST depression in the anterior leads. Chest pain got worse, repeat EKG revealed  1 mm of ST elevation in V4 through V6, diagnostic for STEMI. Patient underwent emergent LHC with Dr. Mady that suggested suspicion for SCAD, otherwise no significant CAD noted. Also severe AS with mean gradient of 58 mmHg. Patient see's Dr. Florencio outpatient. [facility] Cardiology resume care.   Interval History: -Patient seen and examined this AM and laying comfortably in hospital bed. Feeling well today with no CP or SOB.  -Patients BP and HR stable this AM. Overnight Tele showed no significant events.  -Patient remains on room air with stable SpO2.  -Radial site is clean and dry with no evidence of significant swelling, bruising, or active bleeding.   Pertinent Cardiac History (Most recent) LHC (07/03/2024 with Dr. Mady) Severe single-vessel coronary artery disease with occlusion of OM3 and irregular narrowing of distal LCx.  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  Otherwise, no angiographically significant CAD. Mildly reduced left ventricular systolic function (LVEF 45-50%) with akinesis of the mid lateral segment. Borderline  elevated left ventricular filling pressure with LVEDP of 15 mmHg, increasing to 30 mmHg post a-wave suggesting underlying diastolic dysfunction. Severe aortic valve stenosis with mean gradient of 58 mmHg.  Echo (08/2023) MILD LEFT VENTRICULAR SYSTOLIC DYSFUNCTION WITH MILD LVH  ESTIMATED EF: 50%, CALC EF(2D): 50%  NORMAL LA PRESSURES WITH DIASTOLIC DYSFUNCTION  NORMAL RIGHT VENTRICULAR SYSTOLIC FUNCTION  VALVULAR REGURGITATION: MODERATE AR, TRIVIAL MR, MODERATE PR, TRIVIAL TR  VALVULAR STENOSIS: MODERATE AS, No MS, No PS, No TS   AORTIC STENOSIS GRADIENTS INCREASED SINCE PREVIOUS ECHO  AV Peak: 4.66m/s; AVA: 0.9cm^2 ; DI: 0.22  (Prev. exam AoV PV: 2.16m/s: no AVA or DI recorded)   Review of systems complete and found to be negative unless listed above    Past Medical History:  Diagnosis Date   Allergy    Anxiety    Chronic kidney disease    COVID-19 08/10/2020   Loss of taste and smell.  Fatigue.  All resolved by 4 weeks.   CRI (chronic renal insufficiency), stage 3 (moderate) (HCC)    Depression    DJD (degenerative joint disease)    Fatty liver disease, nonalcoholic    GERD (gastroesophageal reflux disease)    Heart murmur    Hypertension    Hypothyroidism    Migraine headache    1-2/month   Obesity    s/p lap band   PONV (postoperative nausea and vomiting)    Sarcoidosis    Stroke (HCC) 1990   Jan/Feb  6 Mini-strokes, then 1 larger stroke that resulted in some left sided numbness   Vertigo    2-3 episodes/year   Wears contact lenses     Past Surgical History:  Procedure Laterality Date  ABDOMINAL HYSTERECTOMY     ABDOMINOPLASTY     COLONOSCOPY     COLONOSCOPY WITH PROPOFOL  N/A 09/17/2021   Procedure: COLONOSCOPY WITH PROPOFOL ;  Surgeon: Maryruth Ole DASEN, MD;  Location: [facility] ENDOSCOPY;  Service: Endoscopy;  Laterality: N/A;   CORONARY/GRAFT ACUTE MI REVASCULARIZATION N/A 07/03/2024   Procedure: Coronary/Graft Acute MI Revascularization;  Surgeon: Mady Bruckner, MD;  Location: [facility] INVASIVE CV LAB;  Service: Cardiovascular;  Laterality: N/A;   ESOPHAGOGASTRODUODENOSCOPY     LAPAROSCOPIC GASTRIC BANDING     Removal 07/2020   LAPAROSCOPIC OOPHERECTOMY     LEFT HEART CATH AND CORONARY ANGIOGRAPHY N/A 07/03/2024   Procedure: LEFT HEART CATH AND CORONARY ANGIOGRAPHY;  Surgeon: Mady Bruckner, MD;  Location: [facility] INVASIVE CV LAB;  Service: Cardiovascular;  Laterality: N/A;   LUNG BIOPSY     lysis fallopian tube adhesion  08/03/2020   NASAL SEPTOPLASTY W/ TURBINOPLASTY Bilateral 11/03/2021   Procedure: NASAL SEPTOPLASTY WITH INFERIOR TURBINATE REDUCTION;  Surgeon: Milissa Hamming, MD;  Location: [facility];  Service: ENT;  Laterality: Bilateral;  PLACED DISK ON OR CHARGE NURSE DESK 10-19 KP   REDUCTION MAMMAPLASTY Bilateral 2016   TUBAL LIGATION      Medications Prior to Admission  Medication Sig Dispense Refill Last Dose/Taking   buPROPion  (WELLBUTRIN  XL) 300 MG 24 hr tablet Take 150 mg by mouth 2 (two) times daily.      calcium gluconate 500 MG tablet Take 1 tablet by mouth daily.      levothyroxine  (SYNTHROID ) 175 MCG tablet Take 175 mcg by mouth daily.      metroNIDAZOLE  (METROCREAM ) 0.75 % cream Apply topically 2 (two) times daily. 30 g 5    Multiple Vitamin (MULTIVITAMIN) tablet Take 1 tablet by mouth daily.      QULIPTA 60 MG TABS Take 1 tablet by mouth daily.      vitamin B-12 (CYANOCOBALAMIN) 1000 MCG tablet Take 1,000 mcg by mouth daily.      Social History   Socioeconomic History   Marital status: Married    Spouse name: Not on file   Number of children: Not on file   Years of education: Not on file   Highest education level: Not on file  Occupational History   Not on file  Tobacco Use   Smoking status: Never   Smokeless tobacco: Never  Vaping Use   Vaping status: Never Used  Substance and Sexual Activity   Alcohol use: Yes    Comment: Rare   Drug use: Never   Sexual activity: Yes    Birth  control/protection: Surgical  Other Topics Concern   Not on file  Social History Narrative   Not on file   Social Drivers of Health   Financial Resource Strain: Low Risk  (08/23/2023)   Received from [facility]   Overall Financial Resource Strain (CARDIA)    Difficulty of Paying Living Expenses: Not hard at all  Food Insecurity: No Food Insecurity (07/03/2024)   Hunger Vital Sign    Worried About Running Out of Food in the Last Year: Never true    Ran Out of Food in the Last Year: Never true  Transportation Needs: No Transportation Needs (07/03/2024)   PRAPARE - Administrator, Civil Service (Medical): No    Lack of Transportation (Non-Medical): No  Physical Activity: Not on file  Stress: Not on file  Social Connections: Not on file  Intimate Partner Violence: Not At Risk (07/03/2024)   Humiliation, Afraid, Rape,  and Kick questionnaire    Fear of Current or Ex-Partner: No    Emotionally Abused: No    Physically Abused: No    Sexually Abused: No    Family History  Problem Relation Age of Onset   Breast cancer Neg Hx      Vitals:   07/04/24 2102 07/05/24 0300 07/05/24 0400 07/05/24 0600  BP: (!) 141/92     Pulse: 83 73 76 74  Resp:  (!) 22 17 14   Temp:      TempSrc:      SpO2:  97% 97% 99%  Weight:      Height:        PHYSICAL EXAM General: Well appearing female, well nourished, in no acute distress. HEENT: Normocephalic and atraumatic. Neck: No JVD.   Lungs: Normal respiratory effort on room air. Clear bilaterally to auscultation. No wheezes, crackles, rhonchi.  Heart: HRRR. Normal S1 and S2, + 3/6 systolic murmur.  Abdomen: Non-distended appearing.  Msk: Normal strength and tone for age. Extremities: Warm and well perfused. No clubbing, cyanosis, edema (baseline RLE trace edema) Neuro: Alert and oriented X 3. Psych: Answers questions appropriately.   Labs: Basic Metabolic Panel: Recent Labs    07/04/24 0256 07/05/24 0302  NA  140 139  K 4.0 3.9  CL 103 101  CO2 28 28  GLUCOSE 107* 105*  BUN 10 11  CREATININE 0.73 0.94  CALCIUM 9.1 9.2   Liver Function Tests: Recent Labs    07/03/24 0232  AST 34  ALT 27  ALKPHOS 64  BILITOT 0.6  PROT 7.1  ALBUMIN 4.3   Recent Labs    07/03/24 0232  LIPASE 72*   CBC: Recent Labs    07/04/24 0256 07/05/24 0302  WBC 9.6 11.3*  NEUTROABS  --  7.8*  HGB 13.5 14.5  HCT 41.1 44.2  MCV 93.0 93.6  PLT 195 202   Cardiac Enzymes: Recent Labs    07/03/24 0232 07/03/24 0538  TROPONINIHS 33* 12,528*   BNP: No results for input(s): BNP in the last 72 hours. D-Dimer: No results for input(s): DDIMER in the last 72 hours. Hemoglobin A1C: Recent Labs    07/03/24 0539  HGBA1C 5.1   Fasting Lipid Panel: Recent Labs    07/03/24 0538  CHOL 139  HDL 77  LDLCALC 58  TRIG 18  CHOLHDL 1.8   Thyroid  Function Tests: Recent Labs    07/03/24 0538  TSH 4.721*   Anemia Panel: No results for input(s): VITAMINB12, FOLATE, FERRITIN, TIBC, IRON, RETICCTPCT in the last 72 hours.   Radiology: ECHOCARDIOGRAM COMPLETE Result Date: 07/05/2024    ECHOCARDIOGRAM REPORT   Patient Name:   Kara Kirk Date of Exam: 07/04/2024 Medical Rec #:  [id number]         Height:       61.0 in Accession #:    [id number]        Weight:       139.1 lb Date of Birth:  1961/05/03         BSA:          1.619 m Patient Age:    63 years          BP:           142/64 mmHg Patient Gender: F                 HR:           72 bpm. Exam Location:  [facility]  Procedure: 2D Echo, Cardiac Doppler and Color Doppler (Both Spectral and Color            Flow Doppler were utilized during procedure). Indications:     Acute MI  History:         Patient has no prior history of Echocardiogram examinations.                  Stroke.  Sonographer:     Carmelita Glendale NESTLE, FE, PE Referring Phys:  [id number] Wyatte Dames Diagnosing Phys: Marsa Dooms MD IMPRESSIONS  1. Left ventricular ejection  fraction, by estimation, is 60 to 65%. The left ventricle has normal function. The left ventricle has no regional wall motion abnormalities. Left ventricular diastolic parameters were normal.  2. Right ventricular systolic function is normal. The right ventricular size is normal.  3. The mitral valve is normal in structure. No evidence of mitral valve regurgitation. No evidence of mitral stenosis.  4. The aortic valve is normal in structure. Aortic valve regurgitation is mild to moderate. Severe aortic valve stenosis.  5. The inferior vena cava is normal in size with greater than 50% respiratory variability, suggesting right atrial pressure of 3 mmHg. FINDINGS  Left Ventricle: Left ventricular ejection fraction, by estimation, is 60 to 65%. The left ventricle has normal function. The left ventricle has no regional wall motion abnormalities. Strain was performed and the global longitudinal strain is indeterminate. The left ventricular internal cavity size was normal in size. There is no left ventricular hypertrophy. Left ventricular diastolic parameters were normal. Right Ventricle: The right ventricular size is normal. No increase in right ventricular wall thickness. Right ventricular systolic function is normal. Left Atrium: Left atrial size was normal in size. Right Atrium: Right atrial size was normal in size. Pericardium: There is no evidence of pericardial effusion. Mitral Valve: The mitral valve is normal in structure. No evidence of mitral valve regurgitation. No evidence of mitral valve stenosis. Tricuspid Valve: The tricuspid valve is normal in structure. Tricuspid valve regurgitation is not demonstrated. No evidence of tricuspid stenosis. Aortic Valve: The aortic valve is normal in structure. Aortic valve regurgitation is mild to moderate. Aortic regurgitation PHT measures 454 msec. Severe aortic stenosis is present. Aortic valve mean gradient measures 35.0 mmHg. Aortic valve peak gradient measures 60.1  mmHg. Aortic valve area, by VTI measures 0.86 cm. Pulmonic Valve: The pulmonic valve was normal in structure. Pulmonic valve regurgitation is not visualized. No evidence of pulmonic stenosis. Aorta: The aortic root is normal in size and structure. Venous: The inferior vena cava is normal in size with greater than 50% respiratory variability, suggesting right atrial pressure of 3 mmHg. IAS/Shunts: No atrial level shunt detected by color flow Doppler. Additional Comments: 3D was performed not requiring image post processing on an independent workstation and was indeterminate.  LEFT VENTRICLE PLAX 2D LVIDd:         3.80 cm   Diastology LVIDs:         2.40 cm   LV e' medial:    4.90 cm/s LV PW:         1.00 cm   LV E/e' medial:  14.1 LV IVS:        1.00 cm   LV e' lateral:   6.64 cm/s LVOT diam:     2.10 cm   LV E/e' lateral: 10.4 LV SV:         65 LV SV Index:   40 LVOT Area:  3.46 cm  RIGHT VENTRICLE RV S prime:     11.90 cm/s TAPSE (M-mode): 1.3 cm LEFT ATRIUM             Index        RIGHT ATRIUM           Index LA diam:        3.60 cm 2.22 cm/m   RA Area:     11.40 cm LA Vol (A2C):   25.9 ml 16.00 ml/m  RA Volume:   27.20 ml  16.80 ml/m LA Vol (A4C):   26.9 ml 16.62 ml/m LA Biplane Vol: 26.8 ml 16.56 ml/m  AORTIC VALVE AV Area (Vmax):    0.76 cm AV Area (Vmean):   0.74 cm AV Area (VTI):     0.86 cm AV Vmax:           387.67 cm/s AV Vmean:          276.333 cm/s AV VTI:            0.753 m AV Peak Grad:      60.1 mmHg AV Mean Grad:      35.0 mmHg LVOT Vmax:         85.40 cm/s LVOT Vmean:        58.700 cm/s LVOT VTI:          0.187 m LVOT/AV VTI ratio: 0.25 AI PHT:            454 msec  AORTA Ao Root diam: 3.30 cm Ao Asc diam:  3.20 cm MITRAL VALVE MV Area (PHT): 3.23 cm    SHUNTS MV Decel Time: 235 msec    Systemic VTI:  0.19 m MV E velocity: 69.20 cm/s  Systemic Diam: 2.10 cm MV A velocity: 93.30 cm/s MV E/A ratio:  0.74 Marsa Dooms MD Electronically signed by Marsa Dooms MD Signature  Date/Time: 07/05/2024/8:01:06 AM    Final    CARDIAC CATHETERIZATION Result Date: 07/03/2024 Conclusions: Severe single-vessel coronary artery disease with occlusion of OM3 and irregular narrowing of distal LCx.  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  Otherwise, no angiographically significant CAD. Mildly reduced left ventricular systolic function (LVEF 45-50%) with akinesis of the mid lateral segment. Borderline elevated left ventricular filling pressure with LVEDP of 15 mmHg, increasing to 30 mmHg post a-wave suggesting underlying diastolic dysfunction. Severe aortic valve stenosis with mean gradient of 58 mmHg. 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  Avoid anticoagulation to minimize risk for further propagation. Check lipid panel (LDL previously well-controlled).  Defer statin for the time being given theoretical risk for increasing potential for SCAD. Obtain echocardiogram. Recommend nonemergent CTA neck, chest, abdomen, and pelvis to evaluate for evidence of fibromuscular dysplasia. Avoid aggressive administration of nitrates and other preload/afterload lowering medications in the setting of severe AS.  Patient will need to be evaluated in multidisciplinary structural heart clinic to determine best timing and approach for management of her aortic valve disease. Lonni Hanson, MD [facility]  DG Chest Port 1 View Result Date: 07/03/2024 CLINICAL DATA:  Chest pain and shortness of breath EXAM: PORTABLE CHEST 1 VIEW COMPARISON:  05/06/2011 FINDINGS: The heart size and mediastinal contours are within normal limits. Both lungs are clear. The visualized skeletal structures are unremarkable. IMPRESSION: No active disease. Electronically Signed   By: Oneil Devonshire M.D.   On: 07/03/2024 03:03   MM 3D DIAGNOSTIC MAMMOGRAM BILATERAL BREAST Result Date: 06/18/2024 CLINICAL DATA:  Two year  follow-up for probably  benign calcifications in the right breast. Patient is due for annual examination of the left breast. EXAM: DIGITAL DIAGNOSTIC BILATERAL MAMMOGRAM WITH TOMOSYNTHESIS AND CAD TECHNIQUE: Bilateral digital diagnostic mammography and breast tomosynthesis was performed. The images were evaluated with computer-aided detection. COMPARISON:  Previous exam(s). ACR Breast Density Category b: There are scattered areas of fibroglandular density. FINDINGS: Spot magnification views of the right breast demonstrate a 3 mm group coarse heterogeneous/dystrophic calcification in the upper far outer position posterior depth. This is mammographically stable in size compared to August 2023, with benign interval coarsening. Given this documented 2 year stability, this is consistent with a benign etiology. No new suspicious mass, calcification, or other findings are identified in bilateral breasts. IMPRESSION: 1. Benign calcifications in the RIGHT breast. 2. No mammographic evidence of malignancy in BILATERAL breasts. RECOMMENDATION: Return to routine screening mammography is recommended. The patient will be due for screening in July 2026. I have discussed the findings and recommendations with the patient. If applicable, a reminder letter will be sent to the patient regarding the next appointment. BI-RADS CATEGORY  2: Benign. Electronically Signed   By: Dirk Arrant M.D.   On: 06/18/2024 09:53    ECHO as above  TELEMETRY reviewed by me 07/05/2024: sinus rhythm, rate 70s  EKG reviewed by me: sinus rhythm with PACs, arte 61 bpm  Data reviewed by me 07/05/2024: last 24h vitals tele labs imaging I/O hospitalist progress notes  Principal Problem:   ST elevation myocardial infarction (STEMI) (HCC) Active Problems:   Anxiety and depression   History of CVA with residual deficit   History of migraine   History of sarcoidosis   Hypothyroidism following radioiodine therapy   S/P gastric bypass   Coronary artery dissection     ASSESSMENT AND PLAN:  Kara Kirk is a 63 y.o. female  with a past medical hypertension, migraine headaches, sarcoidosis, history of hx remote stroke (chronic Plavix), moderate to severe aortic stenosis, moderate AR, Moderate PR, morbid obesity s/p bariatric surgery, hypothyroidism who presented to the ED on 07/03/2024 for sudden onset severe central chest pain that radiated between shoulder blades. EKG in ED revealed less than 1 mm of ST elevation in the inferolateral leads as well as some ST depression in the anterior leads. Chest pain got worse, repeat EKG revealed  1 mm of ST elevation in V4 through V6, diagnostic for STEMI. Patient underwent emergent LHC with Dr. Mady that suggested suspicion for SCAD, otherwise no significant CAD noted. Also severe AS with mean gradient of 58 mmHg. Patient see's Dr. Florencio outpatient. [facility] Cardiology resume care.   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 -Continue metoprolol  tartrate 12.5 mg BID. -Avoid AC given risk of propagation SCAD. -Will defer statin initiation at this time.  -Will plan for CTA neck, chest, abdomen, pelvis to screen for fibromuscular dysplasia outpatient  # Heart Failure with mrEF due to ischemic cardiomyopathy, recovered Echo yesterday with preserved EF, no WMAs. Recovery from LHC LV gram. -Continue to monitor volume status.  -Continue metoprolol  as stated above. -Continue dapagliflozin  10 mg daily for GDMT optimization.  (Copay $30) -Continuspironolactone  25 mg daily.  # Severe Aortic Stenosis  Consistent results on echo this admission.  -Will minimize preload and afterload reducing agents. -Schedule follow-up outpatient with Dr. Katina [facility]) on 09/03 for further evaluation of AS and concern for congenitally malformed aortic valve.    Ok for discharge today from a  cardiac perspective. Will arrange for follow up in clinic with me next week.  Follow-up scheduled  with Dr. Katina to discuss TAVR evaluation on 09/03 at 2:45 PM.   This patient's plan of care was discussed and created with Dr. Ammon and he is in agreement.  Signed: Danita Bloch, PA-C  07/05/2024, 9:06 AM [facility] Cardiology

## 2024-07-05 NOTE — Discharge Summary (Signed)
 Physician Discharge Summary   Patient: Kara Kirk MRN: [id number] DOB: November 02, 1961  Admit date:     07/03/2024  Discharge date: 07/05/24  Discharge Physician: Delon Herald   PCP: Alla Amis, MD   Recommendations at discharge:   You are being referred to cardiac rehabilitation Follow up with cardiology on 8/19 and 9/3 as scheduled Follow up with Dr. Alla in 1-2 weeks; call for an appointment Follow up with endocrinology re: thyroid ; referral made New medications include: Aspirin  81 mg daily Metoprolol  12.5 mg twice daily Spironolactone  25 mg daily Farxiga  10 mg daily Armour thyroid  90 mg daily  Discharge Diagnoses: Principal Problem:   ST elevation myocardial infarction (STEMI) [facility]) Active Problems:   Coronary artery dissection   Anxiety and depression   History of CVA with residual deficit   History of migraine   History of sarcoidosis   Hypothyroidism following radioiodine therapy   S/P gastric bypass    Hospital Course: 63yo with h/o recurrent CVA with resultant L-sided numbness, sarcoidosis, postablative hypothyroidism, s/p gastric bypass, AS, and anxiety/depression/chronic fatigue syndrome who presented on 8/13 with chest pain.  She was taken emergently to the cath lab for STEMI and cath revealed spontaneous dissection of distal LCx for which medical management was recommended.  Assessment and Plan:  ST elevation myocardial infarction involving left circumflex coronary artery/Coronary artery dissection of distal LCX S/p cath on 8/13 Medical management per cardiology Cardiology consulted, recommended avoiding aggressive nitrates and anticoagulation Echocardiogram ordered by Dr. Mady to evaluate for structural abnormalities Continue aspirin  81, metoprolol , Farxiga , spironolactone    Aortic stenosis  Repeat echo with severe AS Prior echo at [facility] in 08/2023 with EF 50%, moderate AR, moderate AS She will need valve replacement once the dissection  is fully recovered She is currently not terribly symptomatic from the AS (fortunately)   S/P gastric bypass No acute issues suspected   Hypothyroidism following radioiodine therapy Continue levothyroxine  TSH is mildly elevated Recommended repeat TFTs in 4-6 weeks with possible dosage adjustment at that time vs. F/u with endocrinology sooner Cardiology has started Armour thyroid  for now   History of sarcoidosis No acute issues suspected Not on chronic medications for this issue, it appears   History of migraine Takes Qulipta for prophylaxis at home   History of CVA with residual deficit Continue aspirin    Anxiety and depression Continue bupropion          Consultants: Cardiology   Procedures: Cardiac catheterization 8/13 Echocardiogram 8/14   Antibiotics: None   Pain control - [place]  Controlled Substance Reporting System database was reviewed. and patient was instructed, not to drive, operate heavy machinery, perform activities at heights, swimming or participation in water  activities or provide baby-sitting services while on Pain, Sleep and Anxiety Medications; until their outpatient Physician has advised to do so again. Also recommended to not to take more than prescribed Pain, Sleep and Anxiety Medications.   Disposition: Home Diet recommendation:  Cardiac diet DISCHARGE MEDICATION: Allergies as of 07/05/2024       Reactions   Penicillin G Other (See Comments)   Other Reaction: labored breathing.   Penicillins Shortness Of Breath   Levothyroxine  Sodium Swelling   Per patient, caused joint swelling and didn't tolerate well. Does fine with Armour Thyroid    Doxycycline  Other (See Comments)   GI upset        Medication List     TAKE these medications    aspirin  81 MG chewable tablet Chew 1 tablet (81 mg total) by mouth daily.  Start taking on: 04-Aug-2024   buPROPion  300 MG 24 hr tablet Commonly known as: WELLBUTRIN  XL Take 150 mg by mouth  2 (two) times daily.   calcium gluconate 500 MG tablet Take 1 tablet by mouth daily.   cyanocobalamin 1000 MCG tablet Commonly known as: VITAMIN B12 Take 1,000 mcg by mouth daily.   dapagliflozin  propanediol 10 MG Tabs tablet Commonly known as: FARXIGA  Take 1 tablet (10 mg total) by mouth daily. Start taking on: 2024-08-04   levothyroxine  175 MCG tablet Commonly known as: SYNTHROID  Take 175 mcg by mouth daily.   metoprolol  tartrate 25 MG tablet Commonly known as: LOPRESSOR  Take 0.5 tablets (12.5 mg total) by mouth 2 (two) times daily.   metroNIDAZOLE  0.75 % cream Commonly known as: METROCREAM  Apply topically 2 (two) times daily.   multivitamin tablet Take 1 tablet by mouth daily.   Qulipta 60 MG Tabs Generic drug: Atogepant Take 1 tablet by mouth daily.   spironolactone  25 MG tablet Commonly known as: ALDACTONE  Take 1 tablet (25 mg total) by mouth daily. Start taking on: Aug 04, 2024   thyroid  90 MG tablet Commonly known as: ARMOUR Take 1 tablet (90 mg total) by mouth daily at 6 (six) AM. Start taking on: 08/04/2024        Follow-up Information     Katina Albright, MD. Go on 07/24/2024.   Specialty: Cardiology Why: 09/03 at 2:45 PM Contact information: [address] [place] [address] [phone]         Hudson, Caralyn, PA-C. Go in 1 week(s).   Specialty: Cardiology Why: Appointment scheduled for 07/09/2024 at 10:30 AM Contact information: [address] [place] [address] [phone]                Discharge Exam:   Subjective: Feels well, eager to go home.  No more CP.  Not terribly symptomatic with AS.  Husband was present.   Objective: Vitals:   07/05/24 0600 07/05/24 0800  BP:  (!) 128/58  Pulse: 74   Resp: 14   Temp:  97.8 F (36.6 C)  SpO2: 99%     Intake/Output Summary (Last 24 hours) at 07/05/2024 1135 Last data filed at 07/04/2024 1809 Gross per 24 hour  Intake --  Output 800 ml  Net  -800 ml   Filed Weights   07/03/24 0224 07/03/24 0449  Weight: 61.2 kg 63.1 kg    Exam:  General:  Appears calm and comfortable and is in NAD Eyes:  normal lids, iris ENT:  grossly normal hearing, lips & tongue, mmm Cardiovascular:  RRR, significant murmur best heard in aortic region. No LE edema.  Respiratory:   CTA bilaterally with no wheezes/rales/rhonchi.  Normal respiratory effort. Abdomen:  soft, NT, ND Skin:  no rash or induration seen on limited exam Musculoskeletal:  grossly normal tone BUE/BLE, good ROM, no bony abnormality Psychiatric:  grossly normal mood and affect, speech fluent and appropriate, AOx3 Neurologic:  CN 2-12 grossly intact, moves all extremities in coordinated fashion  Data Reviewed: I have reviewed the patient's lab results since admission.  Pertinent labs for today include:   Stable BMP WBC 11.3    Condition at discharge: stable  The results of significant diagnostics from this hospitalization (including imaging, microbiology, ancillary and laboratory) are listed below for reference.   Imaging Studies: ECHOCARDIOGRAM COMPLETE Result Date: 07/05/2024    ECHOCARDIOGRAM REPORT   Patient Name:   Kara Kirk Date of Exam:  07/04/2024 Medical Rec #:  [id number]         Height:       61.0 in Accession #:    [id number]        Weight:       139.1 lb Date of Birth:  October 16, 1961         BSA:          1.619 m Patient Age:    63 years          BP:           142/64 mmHg Patient Gender: F                 HR:           72 bpm. Exam Location:  [facility] Procedure: 2D Echo, Cardiac Doppler and Color Doppler (Both Spectral and Color            Flow Doppler were utilized during procedure). Indications:     Acute MI  History:         Patient has no prior history of Echocardiogram examinations.                  Stroke.  Sonographer:     Carmelita Glendale NESTLE, FE, PE Referring Phys:  [id number] CARALYN HUDSON Diagnosing Phys: Marsa Dooms MD IMPRESSIONS  1. Left ventricular  ejection fraction, by estimation, is 60 to 65%. The left ventricle has normal function. The left ventricle has no regional wall motion abnormalities. Left ventricular diastolic parameters were normal.  2. Right ventricular systolic function is normal. The right ventricular size is normal.  3. The mitral valve is normal in structure. No evidence of mitral valve regurgitation. No evidence of mitral stenosis.  4. The aortic valve is normal in structure. Aortic valve regurgitation is mild to moderate. Severe aortic valve stenosis.  5. The inferior vena cava is normal in size with greater than 50% respiratory variability, suggesting right atrial pressure of 3 mmHg. FINDINGS  Left Ventricle: Left ventricular ejection fraction, by estimation, is 60 to 65%. The left ventricle has normal function. The left ventricle has no regional wall motion abnormalities. Strain was performed and the global longitudinal strain is indeterminate. The left ventricular internal cavity size was normal in size. There is no left ventricular hypertrophy. Left ventricular diastolic parameters were normal. Right Ventricle: The right ventricular size is normal. No increase in right ventricular wall thickness. Right ventricular systolic function is normal. Left Atrium: Left atrial size was normal in size. Right Atrium: Right atrial size was normal in size. Pericardium: There is no evidence of pericardial effusion. Mitral Valve: The mitral valve is normal in structure. No evidence of mitral valve regurgitation. No evidence of mitral valve stenosis. Tricuspid Valve: The tricuspid valve is normal in structure. Tricuspid valve regurgitation is not demonstrated. No evidence of tricuspid stenosis. Aortic Valve: The aortic valve is normal in structure. Aortic valve regurgitation is mild to moderate. Aortic regurgitation PHT measures 454 msec. Severe aortic stenosis is present. Aortic valve mean gradient measures 35.0 mmHg. Aortic valve peak gradient  measures 60.1 mmHg. Aortic valve area, by VTI measures 0.86 cm. Pulmonic Valve: The pulmonic valve was normal in structure. Pulmonic valve regurgitation is not visualized. No evidence of pulmonic stenosis. Aorta: The aortic root is normal in size and structure. Venous: The inferior vena cava is normal in size with greater than 50% respiratory variability, suggesting right atrial pressure of 3 mmHg. IAS/Shunts: No atrial level shunt detected by color  flow Doppler. Additional Comments: 3D was performed not requiring image post processing on an independent workstation and was indeterminate.  LEFT VENTRICLE PLAX 2D LVIDd:         3.80 cm   Diastology LVIDs:         2.40 cm   LV e' medial:    4.90 cm/s LV PW:         1.00 cm   LV E/e' medial:  14.1 LV IVS:        1.00 cm   LV e' lateral:   6.64 cm/s LVOT diam:     2.10 cm   LV E/e' lateral: 10.4 LV SV:         65 LV SV Index:   40 LVOT Area:     3.46 cm  RIGHT VENTRICLE RV S prime:     11.90 cm/s TAPSE (M-mode): 1.3 cm LEFT ATRIUM             Index        RIGHT ATRIUM           Index LA diam:        3.60 cm 2.22 cm/m   RA Area:     11.40 cm LA Vol (A2C):   25.9 ml 16.00 ml/m  RA Volume:   27.20 ml  16.80 ml/m LA Vol (A4C):   26.9 ml 16.62 ml/m LA Biplane Vol: 26.8 ml 16.56 ml/m  AORTIC VALVE AV Area (Vmax):    0.76 cm AV Area (Vmean):   0.74 cm AV Area (VTI):     0.86 cm AV Vmax:           387.67 cm/s AV Vmean:          276.333 cm/s AV VTI:            0.753 m AV Peak Grad:      60.1 mmHg AV Mean Grad:      35.0 mmHg LVOT Vmax:         85.40 cm/s LVOT Vmean:        58.700 cm/s LVOT VTI:          0.187 m LVOT/AV VTI ratio: 0.25 AI PHT:            454 msec  AORTA Ao Root diam: 3.30 cm Ao Asc diam:  3.20 cm MITRAL VALVE MV Area (PHT): 3.23 cm    SHUNTS MV Decel Time: 235 msec    Systemic VTI:  0.19 m MV E velocity: 69.20 cm/s  Systemic Diam: 2.10 cm MV A velocity: 93.30 cm/s MV E/A ratio:  0.74 Marsa Dooms MD Electronically signed by Marsa Dooms  MD Signature Date/Time: 07/05/2024/8:01:06 AM    Final    CARDIAC CATHETERIZATION Result Date: 07/03/2024 Conclusions: Severe single-vessel coronary artery disease with occlusion of OM3 and irregular narrowing of distal LCx.  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  Otherwise, no angiographically significant CAD. Mildly reduced left ventricular systolic function (LVEF 45-50%) with akinesis of the mid lateral segment. Borderline elevated left ventricular filling pressure with LVEDP of 15 mmHg, increasing to 30 mmHg post a-wave suggesting underlying diastolic dysfunction. Severe aortic valve stenosis with mean gradient of 58 mmHg. 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  Avoid anticoagulation to minimize risk for further propagation. Check lipid panel (LDL previously well-controlled).  Defer statin for the time being given theoretical risk for increasing potential for SCAD. Obtain echocardiogram. Recommend nonemergent CTA neck, chest, abdomen, and  pelvis to evaluate for evidence of fibromuscular dysplasia. Avoid aggressive administration of nitrates and other preload/afterload lowering medications in the setting of severe AS.  Patient will need to be evaluated in multidisciplinary structural heart clinic to determine best timing and approach for management of her aortic valve disease. Lonni Hanson, MD [facility]  DG Chest Port 1 View Result Date: 07/03/2024 CLINICAL DATA:  Chest pain and shortness of breath EXAM: PORTABLE CHEST 1 VIEW COMPARISON:  05/06/2011 FINDINGS: The heart size and mediastinal contours are within normal limits. Both lungs are clear. The visualized skeletal structures are unremarkable. IMPRESSION: No active disease. Electronically Signed   By: Oneil Devonshire M.D.   On: 07/03/2024 03:03   MM 3D DIAGNOSTIC MAMMOGRAM BILATERAL BREAST Result Date: 06/18/2024 CLINICAL DATA:  Two year follow-up for  probably benign calcifications in the right breast. Patient is due for annual examination of the left breast. EXAM: DIGITAL DIAGNOSTIC BILATERAL MAMMOGRAM WITH TOMOSYNTHESIS AND CAD TECHNIQUE: Bilateral digital diagnostic mammography and breast tomosynthesis was performed. The images were evaluated with computer-aided detection. COMPARISON:  Previous exam(s). ACR Breast Density Category b: There are scattered areas of fibroglandular density. FINDINGS: Spot magnification views of the right breast demonstrate a 3 mm group coarse heterogeneous/dystrophic calcification in the upper far outer position posterior depth. This is mammographically stable in size compared to August 2023, with benign interval coarsening. Given this documented 2 year stability, this is consistent with a benign etiology. No new suspicious mass, calcification, or other findings are identified in bilateral breasts. IMPRESSION: 1. Benign calcifications in the RIGHT breast. 2. No mammographic evidence of malignancy in BILATERAL breasts. RECOMMENDATION: Return to routine screening mammography is recommended. The patient will be due for screening in July 2026. I have discussed the findings and recommendations with the patient. If applicable, a reminder letter will be sent to the patient regarding the next appointment. BI-RADS CATEGORY  2: Benign. Electronically Signed   By: Dirk Arrant M.D.   On: 06/18/2024 09:53    Microbiology: Results for orders placed or performed during the hospital encounter of 07/03/24  MRSA Next Gen by PCR, Nasal     Status: None   Collection Time: 07/03/24  5:02 AM   Specimen: Nasal Mucosa; Nasal Swab  Result Value Ref Range Status   MRSA by PCR Next Gen NOT DETECTED NOT DETECTED Final    Comment: (NOTE) The GeneXpert MRSA Assay (FDA approved for NASAL specimens only), is one component of a comprehensive MRSA colonization surveillance program. It is not intended to diagnose MRSA infection nor to guide or  monitor treatment for MRSA infections. Test performance is not FDA approved in patients less than 42 years old. Performed at [facility], [address] [place], [place] [address]     Labs: CBC: Recent Labs  Lab 07/03/24 0232 07/03/24 0538 07/04/24 0256 07/05/24 0302  WBC 9.8 8.1 9.6 11.3*  NEUTROABS  --   --   --  7.8*  HGB 14.9 13.5 13.5 14.5  HCT 44.2 42.2 41.1 44.2  MCV 90.6 94.2 93.0 93.6  PLT 261 205 195 202   Basic Metabolic Panel: Recent Labs  Lab 07/03/24 0232 07/03/24 0538 07/04/24 0256 07/05/24 0302  NA 138 139 140 139  K 3.5 4.5 4.0 3.9  CL 104 104 103 101  CO2 20* 26 28 28   GLUCOSE 131* 122* 107* 105*  BUN 16 13 10 11   CREATININE 1.00 0.76 0.73 0.94  CALCIUM 9.6 9.1 9.1 9.2   Liver Function Tests:  Recent Labs  Lab 07/03/24 0232  AST 34  ALT 27  ALKPHOS 64  BILITOT 0.6  PROT 7.1  ALBUMIN 4.3   CBG: Recent Labs  Lab 07/03/24 0446  GLUCAP 123*    Discharge time spent: greater than 30 minutes.  Signed: Delon Herald, MD [facility] 07/05/2024

## 2024-07-05 NOTE — Progress Notes (Signed)
 ABC intact, ambulate w/o difficulty. Discharge instruction given to patient. Husband at side. Verbalized information back. W/C to car with volunteer.  No further needs.

## 2024-07-22 DEATH — deceased

## 2025-01-20 ENCOUNTER — Ambulatory Visit: Payer: 59 | Admitting: Dermatology
